# Patient Record
Sex: Female | Born: 1950 | Race: White | Hispanic: No | Marital: Married | Smoking: Never smoker
Health system: Southern US, Community
[De-identification: ages and names within clinical notes are randomized; demographics above are authoritative.]

## PROBLEM LIST (undated history)

## (undated) DIAGNOSIS — E079 Disorder of thyroid, unspecified: Secondary | ICD-10-CM

## (undated) DIAGNOSIS — I00 Rheumatic fever without heart involvement: Secondary | ICD-10-CM

## (undated) DIAGNOSIS — E785 Hyperlipidemia, unspecified: Secondary | ICD-10-CM

## (undated) HISTORY — DX: Disorder of thyroid, unspecified: E07.9

## (undated) HISTORY — PX: PROLAPSED UTERINE FIBROID LIGATION: SHX5400

## (undated) HISTORY — DX: Rheumatic fever without heart involvement: I00

## (undated) HISTORY — DX: Hyperlipidemia, unspecified: E78.5

---

## 2013-03-07 ENCOUNTER — Encounter: Payer: Self-pay | Admitting: Family Medicine

## 2013-03-07 ENCOUNTER — Ambulatory Visit (INDEPENDENT_AMBULATORY_CARE_PROVIDER_SITE_OTHER): Admitting: Family Medicine

## 2013-03-07 VITALS — BP 143/82 | HR 73 | Wt 136.0 lb

## 2013-03-07 DIAGNOSIS — R413 Other amnesia: Secondary | ICD-10-CM

## 2013-03-07 DIAGNOSIS — R5383 Other fatigue: Secondary | ICD-10-CM

## 2013-03-07 DIAGNOSIS — W57XXXA Bitten or stung by nonvenomous insect and other nonvenomous arthropods, initial encounter: Secondary | ICD-10-CM

## 2013-03-07 DIAGNOSIS — E039 Hypothyroidism, unspecified: Secondary | ICD-10-CM

## 2013-03-07 DIAGNOSIS — R5381 Other malaise: Secondary | ICD-10-CM

## 2013-03-07 LAB — VITAMIN B12: Vitamin B-12: 350 pg/mL (ref 211–911)

## 2013-03-07 LAB — T4, FREE: Free T4: 1.3 ng/dL (ref 0.80–1.80)

## 2013-03-07 LAB — RPR

## 2013-03-07 LAB — TSH: TSH: 1.34 u[IU]/mL (ref 0.350–4.500)

## 2013-03-07 MED ORDER — PHOSPHATIDYLSERINE-DHA-EPA 100-19.5-6.5 MG PO CAPS: 1.0000 | ORAL_CAPSULE | Freq: Every day | ORAL | Status: DC

## 2013-03-07 MED ORDER — FOLIC ACID 1 MG PO TABS: 1.0000 mg | ORAL_TABLET | Freq: Every day | ORAL | Status: AC

## 2013-03-07 MED ORDER — L-METHYLFOLATE-B12-B6-B2 6-1-50-5 MG PO TABS: 1.0000 | ORAL_TABLET | Freq: Every day | ORAL | Status: DC

## 2013-03-07 MED ORDER — CYANOCOBALAMIN 1000 MCG/ML IJ SOLN: 1000.0000 ug | Freq: Once | INTRAMUSCULAR | Status: DC

## 2013-03-07 NOTE — Progress Notes (Signed)
  Subjective:    Patient ID: Shannon Shelton, female    DOB: 04/30/1951, 62 y.o.   MRN: [id number]  HPI  Shannon Shelton is here today to discuss her memory.  She has noticed that she has had some trouble with her short term memory for the past 3 weeks.  She has taken some Vitamin B-12 to see if this would help her memory.  She did not notice any improvement.  She had had a tick bite in the past and is concerned about Lyme Disease.     Review of Systems  Constitutional: Negative for fatigue.  Neurological:       She has been having problems with her short-term memory.    Psychiatric/Behavioral: Positive for decreased concentration.    Past Medical History  Diagnosis Date  . Hyperlipidemia   . Rheumatic fever   . Thyroid disease     hyperlipidemia    Family History  Problem Relation Age of Onset  . Heart disease Father   . Cancer Maternal Grandmother     Colon Cancer    History   Social History Narrative   Marital Status: Married Radiation protection practitioner)    Children:  Sons (2)    Pets: Dogs (2)    Living Situation: Lives with  husband   Occupation:  Retired (Real estate)    Education: Engineer, maintenance (IT)    Tobacco Use/Exposure:  None    Alcohol Use:  Occasional   Drug Use:  None   Diet:  Regular   Exercise:  Walking   Hobbies: Yard Work/Photography                 Objective:   Physical Exam  Constitutional: She appears well-nourished. No distress.  HENT:  Head: Normocephalic.  Eyes: No scleral icterus.  Neck: No thyromegaly present.  Cardiovascular: Normal rate, regular rhythm and normal heart sounds.   Pulmonary/Chest: Effort normal and breath sounds normal.  Abdominal: There is no tenderness.  Musculoskeletal: She exhibits no edema and no tenderness.  Neurological: She is alert.  Skin: Skin is warm and dry.  Psychiatric: She has a normal mood and affect. Her behavior is normal. Judgment and thought content normal.          Assessment & Plan:

## 2013-03-08 LAB — LYME AB/WESTERN BLOT REFLEX: B burgdorferi Ab IgG+IgM: 0.26 {ISR}

## 2013-03-09 ENCOUNTER — Other Ambulatory Visit: Payer: Self-pay | Admitting: *Deleted

## 2013-03-09 NOTE — Telephone Encounter (Signed)
Attempted several times to contact patient for her lab results. These were mailed to her today. PG

## 2013-03-10 ENCOUNTER — Telehealth: Payer: Self-pay

## 2013-03-10 NOTE — Telephone Encounter (Signed)
Patient returning your phone call. Calling about test results.

## 2013-04-02 ENCOUNTER — Encounter: Payer: Self-pay | Admitting: Family Medicine

## 2013-04-02 DIAGNOSIS — R5383 Other fatigue: Secondary | ICD-10-CM | POA: Insufficient documentation

## 2013-04-02 DIAGNOSIS — E039 Hypothyroidism, unspecified: Secondary | ICD-10-CM | POA: Insufficient documentation

## 2013-04-02 DIAGNOSIS — W57XXXA Bitten or stung by nonvenomous insect and other nonvenomous arthropods, initial encounter: Secondary | ICD-10-CM | POA: Insufficient documentation

## 2013-04-02 DIAGNOSIS — R413 Other amnesia: Secondary | ICD-10-CM | POA: Insufficient documentation

## 2013-04-02 DIAGNOSIS — R5381 Other malaise: Secondary | ICD-10-CM | POA: Insufficient documentation

## 2013-04-02 NOTE — Assessment & Plan Note (Signed)
Checking her for Lyme Disease.

## 2013-04-02 NOTE — Assessment & Plan Note (Signed)
Checking her thyroid levels.

## 2013-04-02 NOTE — Assessment & Plan Note (Signed)
Checking her Vitamin B12 level.

## 2013-04-02 NOTE — Assessment & Plan Note (Addendum)
Checking some labs to see if there is any reason for her memory problems.  She was given prescriptions for a couple of "medical foods" designed for memory.

## 2013-04-23 ENCOUNTER — Other Ambulatory Visit: Payer: Self-pay | Admitting: Family Medicine

## 2013-06-23 ENCOUNTER — Encounter: Payer: Self-pay | Admitting: Family Medicine

## 2013-06-23 ENCOUNTER — Ambulatory Visit (INDEPENDENT_AMBULATORY_CARE_PROVIDER_SITE_OTHER): Admitting: Family Medicine

## 2013-06-23 VITALS — BP 115/79 | HR 73 | Resp 16 | Ht 66.5 in | Wt 137.0 lb

## 2013-06-23 DIAGNOSIS — Z780 Asymptomatic menopausal state: Secondary | ICD-10-CM

## 2013-06-23 DIAGNOSIS — R413 Other amnesia: Secondary | ICD-10-CM

## 2013-06-23 DIAGNOSIS — N951 Menopausal and female climacteric states: Secondary | ICD-10-CM

## 2013-06-23 DIAGNOSIS — Z23 Encounter for immunization: Secondary | ICD-10-CM

## 2013-06-23 DIAGNOSIS — E039 Hypothyroidism, unspecified: Secondary | ICD-10-CM

## 2013-06-23 MED ORDER — LEVOTHYROXINE SODIUM 112 MCG PO TABS: 112.0000 ug | ORAL_TABLET | Freq: Every day | ORAL | Status: AC

## 2013-06-23 MED ORDER — PROGESTERONE MICRONIZED 100 MG PO CAPS: 100.0000 mg | ORAL_CAPSULE | Freq: Every day | ORAL | Status: DC

## 2013-06-23 MED ORDER — NONFORMULARY OR COMPOUNDED ITEM: 1.0000 "application " | Freq: Every day | Status: AC

## 2013-06-23 NOTE — Patient Instructions (Addendum)
1)  Memory -   We will refer you to Dr. Stacy Gardner for a memory evaluation.  She will decide if you need a CT scan vs MRI or any other evaluation she feels is warranted. If the imaging studies are normal then you need to have a neuropsychological evaluation to help [place] know how best to treat your symptoms.  We'll send her a copy of the labs we did in June.    2)  Hypothyroidism - Refills were sent to [facility].  3)  Menopause - Refills were sent to [facility] and [facility].    4)  Preventative - Remember to get your Mammogram.      Dementia Dementia is a general term for problems with brain function. A person with dementia has memory loss and a hard time with at least one other brain function such as thinking, speaking, or problem solving. Dementia can affect social functioning, how you do your job, your mood, or your personality. The changes may be hidden for a long time. The earliest forms of this disease are usually not detected by family or friends. Dementia can be:  Irreversible.  Potentially reversible.  Partially reversible.  Progressive. This means it can get worse over time. CAUSES  Irreversible dementia causes may include:  Degeneration of brain cells (Alzheimer's disease or lewy body dementia).  Multiple small strokes (vascular dementia).  Infection (chronic meningitis or Creutzfelt-Jakob disease).  Frontotemporal dementia. This affects younger people, age 50 to 55, compared to those who have Alzheimer's disease.  Dementia associated with other disorders like Parkinson's disease, Huntington's disease, or HIV-associated dementia. Potentially or partially reversible dementia causes may include:  Medicines.  Metabolic causes such as excessive alcohol intake, vitamin B12 deficiency, or thyroid disease.  Masses or pressure in the brain such as a tumor, blood clot, or hydrocephalus. SYMPTOMS  Symptoms are often hard to detect. Family members or coworkers  may not notice them early in the disease process. Different people with dementia may have different symptoms. Symptoms can include:  A hard time with memory, especially recent memory. Long-term memory may not be impaired.  Asking the same question multiple times or forgetting something someone just said.  A hard time speaking your thoughts or finding certain words.  A hard time solving problems or performing familiar tasks (such as how to use a telephone).  Sudden changes in mood.  Changes in personality, especially increasing moodiness or mistrust.  Depression.  A hard time understanding complex ideas that were never a problem in the past. DIAGNOSIS  There are no specific tests for dementia.   Your caregiver may recommend a thorough evaluation. This is because some forms of dementia can be reversible. The evaluation will likely include a physical exam and getting a detailed history from you and a family member. The history often gives the best clues and suggestions for a diagnosis.  Memory testing may be done. A detailed brain function evaluation called neuropsychologic testing may be helpful.  Lab tests and brain imaging (such as a CT scan or MRI scan) are sometimes important.  Sometimes observation and re-evaluation over time is very helpful. TREATMENT  Treatment depends on the cause.   If the problem is a vitamin deficiency, it may be helped or cured with supplements.  For dementias such as Alzheimer's disease, medicines are available to stabilize or slow the course of the disease. There are no cures for this type of dementia.  Your caregiver can help direct you to groups,  organizations, and other caregivers to help with decisions in the care of you or your loved one. HOME CARE INSTRUCTIONS The care of individuals with dementia is varied and dependent upon the progression of the dementia. The following suggestions are intended for the person living with, or caring for, the  person with dementia.  Create a safe environment.  Remove the locks on bathroom doors to prevent the person from accidentally locking himself or herself in.  Use childproof latches on kitchen cabinets and any place where cleaning supplies, chemicals, or alcohol are kept.  Use childproof covers in unused electrical outlets.  Install childproof devices to keep doors and windows secured.  Remove stove knobs or install safety knobs and an automatic shut-off on the stove.  Lower the temperature on water heaters.  Label medicines and keep them locked up.  Secure knives, lighters, matches, power tools, and guns, and keep these items out of reach.  Keep the house free from clutter. Remove rugs or anything that might contribute to a fall.  Remove objects that might break and hurt the person.  Make sure lighting is good, both inside and outside.  Install grab rails as needed.  Use a monitoring device to alert you to falls or other needs for help.  Reduce confusion.  Keep familiar objects and people around.  Use night lights or dim lights at night.  Label items or areas.  Use reminders, notes, or directions for daily activities or tasks.  Keep a simple, consistent routine for waking, meals, bathing, dressing, and bedtime.  Create a calm, quiet environment.  Place large clocks and calendars prominently.  Display emergency numbers and home address near all telephones.  Use cues to establish different times of the day. An example is to open curtains to let the natural light in during the day.   Use effective communication.  Choose simple words and short sentences.  Use a gentle, calm tone of voice.  Be careful not to interrupt.  If the person is struggling to find a word or communicate a thought, try to provide the word or thought.  Ask one question at a time. Allow the person ample time to answer questions. Repeat the question again if the person does not  respond.  Reduce nighttime restlessness.  Provide a comfortable bed.  Have a consistent nighttime routine.  Ensure a regular walking or physical activity schedule. Involve the person in daily activities as much as possible.  Limit napping during the day.  Limit caffeine.  Attend social events that stimulate rather than overwhelm the senses.  Encourage good nutrition and hydration.  Reduce distractions during meal times and snacks.  Avoid foods that are too hot or too cold.  Monitor chewing and swallowing ability.  Continue with routine vision, hearing, dental, and medical screenings.  Only give over-the-counter or prescription medicines as directed by the caregiver.  Monitor driving abilities. Do not allow the person to drive when safe driving is no longer possible.  Register with an identification program which could provide location assistance in the event of a missing person situation. SEEK MEDICAL CARE IF:   New behavioral problems start such as moodiness, aggressiveness, or seeing things that are not there (hallucinations).  Any new problem with brain function happens. This includes problems with balance, speech, or falling a lot.  Problems with swallowing develop.  Any symptoms of other illness happen. Small changes or worsening in any aspect of brain function can be a sign that the illness is getting  worse. It can also be a sign of another medical illness such as infection. Seeing a caregiver right away is important. SEEK IMMEDIATE MEDICAL CARE IF:   A fever develops.  New or worsened confusion develops.  New or worsened sleepiness develops.  Staying awake becomes hard to do. Document Released: 03/03/2001 Document Revised: 11/30/2011 Document Reviewed: 02/02/2011 [facility] Patient Information 2014 [place], [place].

## 2013-06-23 NOTE — Progress Notes (Signed)
Subjective:    Patient ID: Shannon Shelton, female    DOB: 08/18/1951, 62 y.o.   MRN: [id number]  HPI  Shannon Shelton is here today to discuss the conditions listed below:    1)  Short Term Memory Loss:  She continues to struggle with this problem.  She did not fill the DHA-EPA and Cerofolin prescribed in her last office visit.  She is taking Iron 325 mg which seem to help her some.  Since her meossmory loss came on so suddenly, she would like to have an evaluation to possibly find out the cause.  She has been reading about silent strokes which she feels may explain her symptoms. She reports that only a couple of people in her family have had any memory issues (Maternal Great Grandmother and Maternal Uncle).  She would like to be referred to Dr. Stacy Gardner for further evaluation.    2)  HRT:  She needs a refill on her Prometrium and BI-Est Cream.   3)  Hypothyroidism:  She is doing well on her current dose of levothyroxine.  She needs a refill on it.    4)  Mood:  Currently, she feels that her mood is good and her energy level is back to normal.   In early June, when the memory issues started she also noted that her mood was not as good.  She felt more depressed than usual and was tearful at times which is very much not her normal state.  She started taking some iron and says that these symptoms have improved but the memory has not.      Review of Systems  Constitutional: Negative.   HENT: Negative.   Eyes: Negative.   Respiratory: Negative.   Cardiovascular: Negative.   Gastrointestinal: Negative.   Endocrine: Negative.   Genitourinary: Negative.   Musculoskeletal: Negative.   Skin: Negative.   Allergic/Immunologic: Negative.   Neurological:       Short Term Memory   Hematological: Negative.   Psychiatric/Behavioral:       She does not feel depressed or anxious and is sleeping fine.      Past Medical History  Diagnosis Date  . Hyperlipidemia   . Rheumatic fever   . Thyroid disease      hyperlipidemia     Family History  Problem Relation Age of Onset  . Heart disease Father   . Cancer Maternal Grandmother     Colon Cancer     History   Social History Narrative   Marital Status: Married Radiation protection practitioner)    Children:  Sons (2)    Pets: Dogs (2)    Living Situation: Lives with  husband   Occupation:  Retired (Real estate)    Education: Engineer, maintenance (IT)    Tobacco Use/Exposure:  None    Alcohol Use:  Occasional   Drug Use:  None   Diet:  Regular   Exercise:  Walking   Hobbies: Yard Work/Photography               Objective:   Physical Exam  Vitals reviewed. Constitutional: She is oriented to person, place, and time. She appears well-developed and well-nourished.  Eyes: Conjunctivae are normal. No scleral icterus.  Neck: Neck supple. No thyromegaly present.  Cardiovascular: Normal rate, regular rhythm and normal heart sounds.   Pulmonary/Chest: Effort normal and breath sounds normal.  Musculoskeletal: She exhibits no edema and no tenderness.  Lymphadenopathy:    She has no cervical adenopathy.  Neurological: She is alert and oriented  to person, place, and time.  Skin: Skin is warm and dry.  Psychiatric: She has a normal mood and affect. Her behavior is normal. Judgment and thought content normal.      Assessment & Plan:

## 2013-06-24 DIAGNOSIS — Z23 Encounter for immunization: Secondary | ICD-10-CM | POA: Insufficient documentation

## 2013-06-24 DIAGNOSIS — Z780 Asymptomatic menopausal state: Secondary | ICD-10-CM | POA: Insufficient documentation

## 2013-06-24 NOTE — Assessment & Plan Note (Signed)
The patient confirmed that they are not allergic to eggs and have never had a bad reaction with the flu shot in the past.  The vaccination was given without difficulty.   

## 2013-06-24 NOTE — Assessment & Plan Note (Signed)
Refilled her Prometrium and Bi-est cream.

## 2013-06-24 NOTE — Assessment & Plan Note (Signed)
We'll refer her to Dr. Stacy Gardner.

## 2013-06-24 NOTE — Assessment & Plan Note (Signed)
Refilled her levothyroxine.   

## 2014-03-27 ENCOUNTER — Encounter (HOSPITAL_COMMUNITY): Payer: Self-pay | Admitting: Emergency Medicine

## 2014-03-27 ENCOUNTER — Emergency Department (HOSPITAL_COMMUNITY)

## 2014-03-27 ENCOUNTER — Other Ambulatory Visit: Payer: Self-pay

## 2014-03-27 ENCOUNTER — Emergency Department (HOSPITAL_COMMUNITY)
Admission: EM | Admit: 2014-03-27 | Discharge: 2014-03-27 | Disposition: A | Discharge status: Home / Self Care | Attending: Emergency Medicine | Admitting: Emergency Medicine

## 2014-03-27 DIAGNOSIS — Z79899 Other long term (current) drug therapy: Secondary | ICD-10-CM | POA: Insufficient documentation

## 2014-03-27 DIAGNOSIS — Z8669 Personal history of other diseases of the nervous system and sense organs: Secondary | ICD-10-CM | POA: Insufficient documentation

## 2014-03-27 DIAGNOSIS — I10 Essential (primary) hypertension: Secondary | ICD-10-CM | POA: Insufficient documentation

## 2014-03-27 DIAGNOSIS — R42 Dizziness and giddiness: Secondary | ICD-10-CM | POA: Diagnosis not present

## 2014-03-27 DIAGNOSIS — E785 Hyperlipidemia, unspecified: Secondary | ICD-10-CM | POA: Diagnosis not present

## 2014-03-27 DIAGNOSIS — R002 Palpitations: Secondary | ICD-10-CM | POA: Diagnosis not present

## 2014-03-27 DIAGNOSIS — R079 Chest pain, unspecified: Secondary | ICD-10-CM | POA: Diagnosis present

## 2014-03-27 DIAGNOSIS — R110 Nausea: Secondary | ICD-10-CM | POA: Insufficient documentation

## 2014-03-27 DIAGNOSIS — Z8679 Personal history of other diseases of the circulatory system: Secondary | ICD-10-CM

## 2014-03-27 DIAGNOSIS — E039 Hypothyroidism, unspecified: Secondary | ICD-10-CM

## 2014-03-27 LAB — CBC WITH DIFFERENTIAL/PLATELET
BASOS PCT: 1 % (ref 0–1)
Basophils Absolute: 0.1 10*3/uL (ref 0.0–0.1)
EOS ABS: 0.4 10*3/uL (ref 0.0–0.7)
Eosinophils Relative: 7 % — ABNORMAL HIGH (ref 0–5)
HEMATOCRIT: 40.5 % (ref 36.0–46.0)
HEMOGLOBIN: 13.6 g/dL (ref 12.0–15.0)
Lymphocytes Relative: 27 % (ref 12–46)
Lymphs Abs: 1.5 10*3/uL (ref 0.7–4.0)
MCH: 30.9 pg (ref 26.0–34.0)
MCHC: 33.6 g/dL (ref 30.0–36.0)
MCV: 92 fL (ref 78.0–100.0)
MONO ABS: 0.4 10*3/uL (ref 0.1–1.0)
MONOS PCT: 8 % (ref 3–12)
Neutro Abs: 3.2 10*3/uL (ref 1.7–7.7)
Neutrophils Relative %: 57 % (ref 43–77)
Platelets: 244 10*3/uL (ref 150–400)
RBC: 4.4 MIL/uL (ref 3.87–5.11)
RDW: 13 % (ref 11.5–15.5)
WBC: 5.6 10*3/uL (ref 4.0–10.5)

## 2014-03-27 LAB — BASIC METABOLIC PANEL
Anion gap: 10 (ref 5–15)
BUN: 10 mg/dL (ref 6–23)
CO2: 28 mEq/L (ref 19–32)
CREATININE: 0.78 mg/dL (ref 0.50–1.10)
Calcium: 9.2 mg/dL (ref 8.4–10.5)
Chloride: 103 mEq/L (ref 96–112)
GFR, EST NON AFRICAN AMERICAN: 88 mL/min — AB (ref >90)
Glucose, Bld: 100 mg/dL — ABNORMAL HIGH (ref 70–99)
Potassium: 5 mEq/L (ref 3.7–5.3)
Sodium: 141 mEq/L (ref 137–147)

## 2014-03-27 LAB — TSH: TSH: 7.29 u[IU]/mL — ABNORMAL HIGH (ref 0.350–4.500)

## 2014-03-27 LAB — TROPONIN I: Troponin I: <0.3 ng/mL (ref <0.30)

## 2014-03-27 NOTE — ED Notes (Signed)
Pt reports cardiologist just left bedside and talking with EDP.

## 2014-03-27 NOTE — Discharge Instructions (Signed)
Tests were good. Followup with Dr Purvis SheffieldKoneswaran as discussed.  Return if worse in any way

## 2014-03-27 NOTE — Progress Notes (Signed)
Received in basket from Dr.koneswaran,asked to sched exercise nuc,echo and event monitor followed by apt with Dr.Koneswaran.Pt still in ED per Rosey Batheresa in ED,asked that she stop by cardiology to have monitor placed on and instructions for tests given

## 2014-03-27 NOTE — ED Provider Notes (Addendum)
CSN: [id number]     Arrival date & time 03/27/14  [id number] History  This chart was scribed for Donnetta HutchingBrian Dreux Mcgroarty, MD,  by Ashley JacobsBrittany Andrews, ED Scribe. The patient was seen in room APA04/APA04 and the patient's care was started at 1:49 PM.   First MD Initiated Contact with Patient 03/27/14 [phone]     Chief Complaint  Patient presents with  . Chest Pain     (Consider location/radiation/quality/duration/timing/severity/associated sxs/prior Treatment) Patient is a 63 y.o. female presenting with chest pain. The history is provided by the patient and medical records. No language interpreter was used.  Chest Pain Pain location:  Substernal area and L chest Pain radiates to:  Does not radiate Pain radiates to the back: no   Pain severity:  Mild Onset quality:  Sudden Timing:  Constant Progression:  Partially resolved Chronicity:  New Relieved by:  Nothing Associated symptoms: nausea and palpitations   Associated symptoms: not vomiting    HPI Comments: Shannon Shelton is a 63 y.o. female who is borderline HTN presents to the Emergency Department complaining of intermittent, mild, left central chest pain that was much worse this morning, onset one week ago.The pain feels like a mild cramping sensation. She had several episodes today PTA and one while ED today. The episodes generally last 15 minutes. Pt mentions having the sensation that her heart was  fluttering last night as she was lying down.  She also felt as though her heart rate was decreasing. She denies having any present chest pain. Denies hx of any prior heart conditions. Pt had nausea and lightheadedness  as an associated symptoms.Pt reguraly takes thyriod medications, vitmain D, and iron tables. Pt's father had rheumatic fever and died from emphysema. . Pt's cardiologist is Dr. Beverely Paceheek. She had a stress test and EKG three years ago and the results were normal.   Pt does not smoke tobacco. She drinks alcohol once a month.   Past Medical History   Diagnosis Date  . Hyperlipidemia   . Rheumatic fever   . Thyroid disease     hyperlipidemia   History reviewed. No pertinent past surgical history. Family History  Problem Relation Age of Onset  . Heart disease Father   . COPD Father     He was a smoker   . Cancer Maternal Grandmother     Colon Cancer  . Cancer Cousin     Brain Tumor    History  Substance Use Topics  . Smoking status: Never Smoker   . Smokeless tobacco: Never Used  . Alcohol Use: Yes   OB History   Grav Para Term Preterm Abortions TAB SAB Ect Mult Living                 Review of Systems  Cardiovascular: Positive for palpitations. Negative for chest pain.  Gastrointestinal: Positive for nausea. Negative for vomiting.  Neurological: Positive for light-headedness.  All other systems reviewed and are negative.     Allergies  Review of patient's allergies indicates no known allergies.  Home Medications   Prior to Admission medications   Medication Sig Start Date End Date Taking? Authorizing Provider  ergocalciferol (VITAMIN D2) 50000 UNITS capsule Take 50,000 Units by mouth once a week.    Yes Historical Provider, MD  levothyroxine (SYNTHROID, LEVOTHROID) 112 MCG tablet Take 1 tablet (112 mcg total) by mouth daily. 06/23/13 06/23/14 Yes Gillian Scarceobyn K Zanard, MD  NONFORMULARY OR COMPOUNDED ITEM Apply 1 application topically daily. Bi-Est Cream 80/20 (Estriol/Estradiol) 5mg /gm, Apply 1cc  to inner labia per day. 06/23/13 06/23/14 Yes Gillian Scarceobyn K Zanard, MD  progesterone (PROMETRIUM) 100 MG capsule Take 100 mg by mouth every other day.   Yes Historical Provider, MD   BP 108/61  Pulse 61  Temp(Src) 98.2 F (36.8 C) (Oral)  Resp 18  Ht 5\' 7"  (1.702 m)  Wt 138 lb (62.596 kg)  BMI 21.61 kg/m2  SpO2 100% Physical Exam  Nursing note and vitals reviewed. Constitutional: She is oriented to person, place, and time. She appears well-developed and well-nourished.  HENT:  Head: Normocephalic and atraumatic.  Eyes:  Conjunctivae and EOM are normal. Pupils are equal, round, and reactive to light.  Neck: Normal range of motion. Neck supple.  Cardiovascular: Normal rate, regular rhythm and normal heart sounds.   Pulmonary/Chest: Effort normal and breath sounds normal.  Abdominal: Soft. Bowel sounds are normal.  Musculoskeletal: Normal range of motion.  Neurological: She is alert and oriented to person, place, and time.  Skin: Skin is warm and dry.  Psychiatric: She has a normal mood and affect. Her behavior is normal.    ED Course  Procedures (including critical care time) DIAGNOSTIC STUDIES: Oxygen Saturation is 100% on room air, normal by my interpretation.    COORDINATION OF CARE:  1:49 PM Discussed course of care with pt . Pt understands and agrees.   Labs Review Labs Reviewed  CBC WITH DIFFERENTIAL - Abnormal; Notable for the following:    Eosinophils Relative 7 (*)    All other components within normal limits  BASIC METABOLIC PANEL - Abnormal; Notable for the following:    Glucose, Bld 100 (*)    GFR calc non Af Amer 88 (*)    All other components within normal limits  TROPONIN I  TROPONIN I  TSH    Imaging Review Dg Chest 2 View  03/27/2014   CLINICAL DATA:  One week history of chest pain  EXAM: CHEST  2 VIEW  COMPARISON:  None.  FINDINGS: The lungs are mildly hyperinflated but clear. The heart and mediastinal structures are unremarkable. There is no pleural effusion or pneumothorax. There is gentle dextroscoliosis of the mid thoracic spine.  IMPRESSION: There is no acute cardiopulmonary abnormality. Mild hyperinflation may be voluntary or could reflect underlying reactive airway disease.   Electronically Signed   By: David  [place]az[place]   On: 03/27/2014 08:30     EKG Interpretation   Date/Time:  Tuesday March 27 2014 06:47:51 EDT Ventricular Rate:  62 PR Interval:  158 QRS Duration: 96 QT Interval:  432 QTC Calculation: 439 R Axis:   66 Text Interpretation:  Sinus rhythm Confirmed  by Corrine Tillis  MD, Mamye Bolds ([id number]) on  03/27/2014 10:40:33 AM      MDM   Final diagnoses:  Chest pain, unspecified chest pain type   Chest pain for one week..  Minimal risk factor profile other than hyperlipidemia.  Negative family history. Consult cardiology 1350   cardiology consult obtained. Troponins negative x2. She will get outpatient followup with  cards  I personally performed the services described in this documentation, which was scribed in my presence. The recorded information has been reviewed and is accurate.    Donnetta HutchingBrian Trenell Concannon, MD 03/27/14 1132  Donnetta HutchingBrian Hisham Provence, MD 03/27/14 1350

## 2014-03-27 NOTE — ED Notes (Signed)
[facility] called and reported for pt to go to their office at d/c.

## 2014-03-27 NOTE — ED Notes (Signed)
Patient states that she has been having irregular heart beats at night when she lays down and that her heart rate drops. Also states that she had chest pain this morning but is not currently having chest pain at this time. Denies history of heart problems.

## 2014-03-27 NOTE — ED Notes (Signed)
EDP at bedside re-evaluating pt.

## 2014-03-27 NOTE — ED Notes (Signed)
Pt d/c to [facility]ology per cardiologist request.

## 2014-03-27 NOTE — Consult Note (Signed)
CARDIOLOGY CONSULT NOTE  Patient ID: Shannon Shelton MRN: [id number] DOB/AGE: [birth year] 63 y.o.  Admit date: 03/27/2014 Primary Physician Birdena JubileeZANARD, ROBYN, MD  Reason for Consultation: chest pain, palpitations  HPI: The patient is a 63 yr old woman with a history of hypothyroidism and rheumatic fever as a child who has been experiencing palpitations for the last week. She has noticed them more in the evening while lying down, and has monitored her HR and has found it to be 50 bpm with occasional skipped beats. She experiences nausea with these. This morning, she was awoken by chest pain which was retrosternal and described as an ache. It lasted about 10 minutes and spontaneously resolved. She says it was not severe, but wanted to be evaluated given her palpitations over the past week. She denies associated shortness of breath, lightheadedness, and nausea with the chest pain. She denies orthopnea, leg swelling, and PND. She denies a history of syncope. Initial troponin and ECG are entirely normal, as is chest xray.  Soc: Married. Originally from [place]. Nonsmoker, no ETOH.  Fam: Father had arrhythmia and rheumatic fever.  No Known Allergies  No current facility-administered medications for this encounter.   Current Outpatient Prescriptions  Medication Sig Dispense Refill  . ergocalciferol (VITAMIN D2) 50000 UNITS capsule Take 50,000 Units by mouth once a week.       . levothyroxine (SYNTHROID, LEVOTHROID) 112 MCG tablet Take 1 tablet (112 mcg total) by mouth daily.  90 tablet  3  . NONFORMULARY OR COMPOUNDED ITEM Apply 1 application topically daily. Bi-Est Cream 80/20 (Estriol/Estradiol) 5mg /gm, Apply 1cc to inner labia per day.  1 each  11  . progesterone (PROMETRIUM) 100 MG capsule Take 100 mg by mouth every other day.        Past Medical History  Diagnosis Date  . Hyperlipidemia   . Rheumatic fever   . Thyroid disease     hyperlipidemia    History reviewed. No pertinent  past surgical history.  History   Social History  . Marital Status: Married    Spouse Name: Peyton NajjarLarry    Number of Children: 2  . Years of Education: 14   Occupational History  . Retired    Social History Main Topics  . Smoking status: Never Smoker   . Smokeless tobacco: Never Used  . Alcohol Use: Yes  . Drug Use: No  . Sexual Activity: Yes   Other Topics Concern  . Not on file   Social History Narrative   Marital Status: Married Peyton Najjar(Larry)    Children:  Sons (2)    Pets: Dogs (2)    Living Situation: Lives with  husband   Occupation:  Retired (Real estate)    Education: Engineer, maintenance (IT)College Graduate    Tobacco Use/Exposure:  None    Alcohol Use:  Occasional   Drug Use:  None   Diet:  Regular   Exercise:  Walking   Hobbies: Yard Work/Photography              No family history of premature CAD in 1st degree relatives.  Prior to Admission medications   Medication Sig Start Date End Date Taking? Authorizing Provider  ergocalciferol (VITAMIN D2) 50000 UNITS capsule Take 50,000 Units by mouth once a week.    Yes Historical Provider, MD  levothyroxine (SYNTHROID, LEVOTHROID) 112 MCG tablet Take 1 tablet (112 mcg total) by mouth daily. 06/23/13 06/23/14 Yes Gillian Scarceobyn K Zanard, MD  NONFORMULARY OR COMPOUNDED ITEM Apply 1 application topically  daily. Bi-Est Cream 80/20 (Estriol/Estradiol) 5mg /gm, Apply 1cc to inner labia per day. 06/23/13 06/23/14 Yes Gillian Scarceobyn K Zanard, MD  progesterone (PROMETRIUM) 100 MG capsule Take 100 mg by mouth every other day.   Yes Historical Provider, MD     Review of systems complete and found to be negative unless listed above in HPI     Physical exam Blood pressure 122/64, pulse 61, temperature 98.2 F (36.8 C), temperature source Oral, resp. rate 18, height 5\' 7"  (1.702 m), weight 138 lb (62.596 kg), SpO2 100.00%. General: NAD Neck: No JVD, no thyromegaly or thyroid nodule.  Lungs: Clear to auscultation bilaterally with normal respiratory effort. CV:  Nondisplaced PMI. Regular rate and rhythm, normal S1/S2, no S3/S4, no murmur.  No peripheral edema.  No carotid bruit.  Normal pedal pulses.  Abdomen: Soft, nontender, no hepatosplenomegaly, no distention.  Skin: Intact without lesions or rashes.  Neurologic: Alert and oriented x 3.  Psych: Normal affect. Extremities: No clubbing or cyanosis.  HEENT: Normal.   Labs:   Lab Results  Component Value Date   WBC 5.6 03/27/2014   HGB 13.6 03/27/2014   HCT 40.5 03/27/2014   MCV 92.0 03/27/2014   PLT 244 03/27/2014    Recent Labs Lab 03/27/14 0652  NA 141  K 5.0  CL 103  CO2 28  BUN 10  CREATININE 0.78  CALCIUM 9.2  GLUCOSE 100*   Lab Results  Component Value Date   TROPONINI <0.30 03/27/2014    No results found for this basename: CHOL   No results found for this basename: HDL   No results found for this basename: LDLCALC   No results found for this basename: TRIG   No results found for this basename: CHOLHDL   No results found for this basename: LDLDIRECT         Studies: Dg Chest 2 View  03/27/2014   CLINICAL DATA:  One week history of chest pain  EXAM: CHEST  2 VIEW  COMPARISON:  None.  FINDINGS: The lungs are mildly hyperinflated but clear. The heart and mediastinal structures are unremarkable. There is no pleural effusion or pneumothorax. There is gentle dextroscoliosis of the mid thoracic spine.  IMPRESSION: There is no acute cardiopulmonary abnormality. Mild hyperinflation may be voluntary or could reflect underlying reactive airway disease.   Electronically Signed   By: David  [place]   On: 03/27/2014 08:30    ASSESSMENT AND PLAN:  1. Chest pain: First troponin and ECG are normal. No further episodes of chest discomfort. Will check a repeat troponin now. If normal, she can be discharged from the ED and I will arrange for an outpatient echocardiogram and exercise Cardiolite stress test for further clarification. I did not appreciate any abnormalities on cardiovascular  examination. The patient is in favor of this plan, and I have discussed this with Dr. Adriana Simasook. 2. Palpitations: I will obtain an outpatient 2-week event monitor to evaluate for bradyarrhythmias and premature contractions.  Dispo: I will arrange for outpatient f/u with me after testing is completed.  Signed: Prentice DockerSuresh Koneswaran, M.D., F.A.C.C.  03/27/2014, 12:12 PM

## 2014-04-04 ENCOUNTER — Ambulatory Visit (HOSPITAL_COMMUNITY)
Admission: RE | Admit: 2014-04-04 | Discharge: 2014-04-04 | Disposition: A | Discharge status: Home / Self Care | Source: Ambulatory Visit | Attending: Cardiovascular Disease | Admitting: Cardiovascular Disease

## 2014-04-04 ENCOUNTER — Encounter (HOSPITAL_COMMUNITY)
Admission: RE | Admit: 2014-04-04 | Discharge: 2014-04-04 | Disposition: A | Discharge status: Home / Self Care | Source: Ambulatory Visit | Attending: Cardiovascular Disease | Admitting: Cardiovascular Disease

## 2014-04-04 ENCOUNTER — Encounter (HOSPITAL_COMMUNITY): Payer: Self-pay

## 2014-04-04 ENCOUNTER — Telehealth: Payer: Self-pay | Admitting: *Deleted

## 2014-04-04 DIAGNOSIS — R079 Chest pain, unspecified: Secondary | ICD-10-CM

## 2014-04-04 DIAGNOSIS — I059 Rheumatic mitral valve disease, unspecified: Secondary | ICD-10-CM

## 2014-04-04 DIAGNOSIS — R072 Precordial pain: Secondary | ICD-10-CM | POA: Insufficient documentation

## 2014-04-04 DIAGNOSIS — R002 Palpitations: Secondary | ICD-10-CM | POA: Diagnosis not present

## 2014-04-04 MED ORDER — SODIUM CHLORIDE 0.9 % IJ SOLN
10.0000 mL | INTRAMUSCULAR | Status: DC | PRN
  Administered 2014-04-04: 10 mL via INTRAVENOUS

## 2014-04-04 MED ORDER — TECHNETIUM TC 99M SESTAMIBI - CARDIOLITE
30.0000 | Freq: Once | INTRAVENOUS | Status: AC | PRN
  Administered 2014-04-04: 11:00:00 30 via INTRAVENOUS

## 2014-04-04 MED ORDER — TECHNETIUM TC 99M SESTAMIBI GENERIC - CARDIOLITE
10.0000 | Freq: Once | INTRAVENOUS | Status: AC | PRN
  Administered 2014-04-04: 10 via INTRAVENOUS

## 2014-04-04 MED ORDER — SODIUM CHLORIDE 0.9 % IJ SOLN
INTRAMUSCULAR | Status: AC
  Administered 2014-04-04: 10 mL via INTRAVENOUS
  Filled 2014-04-04: qty 10

## 2014-04-04 NOTE — Telephone Encounter (Signed)
Pt notified of result, understood and very happy

## 2014-04-04 NOTE — Telephone Encounter (Signed)
Message copied by Vernon PreyBARKER, Aylah Yeary T on Wed Apr 04, 2014  3:59 PM ------      Message from: Prentice DockerKONESWARAN, SURESH A      Created: Wed Apr 04, 2014  3:40 PM       Normal pumping function. Good results. ------

## 2014-04-04 NOTE — Progress Notes (Signed)
  Echocardiogram 2D Echocardiogram has been performed.  Shannon Shelton 04/04/2014, 9:15 AM

## 2014-04-04 NOTE — Progress Notes (Signed)
Stress Lab Nurses Notes - [facility]  Kendrick FriesLinda Tousley 04/04/2014 Reason for doing test: Chest Pain and palpitation Type of test: Stress Cardiolite Nurse performing test: Parke PoissonPhyllis Billingsly, RN Nuclear Medicine Tech: Lyndel Pleasureyan Liles Echo Tech: Not Applicable MD performing test: Nilda RiggsBranch Family MD: Zanard Test explained and consent signed: Yes.   IV started: 22g jelco, Saline lock flushed, No redness or edema and Saline lock started in radiology Symptoms: None Treatment/Intervention: None Reason test stopped: reached target HR After recovery IV was: Discontinued via X-ray tech and No redness or edema Patient to return to Nuc. Med at : 11:45 Patient discharged: Home Patient's Condition upon discharge was: stable Comments: During test peak BP 174/85 & HR 166 .  Recovery BP 127/72 & HR 83.  Symptoms resolved in recovery. Erskine SpeedBillingsley, Sava Proby T

## 2014-04-26 ENCOUNTER — Other Ambulatory Visit: Payer: Self-pay | Admitting: *Deleted

## 2014-04-26 DIAGNOSIS — R079 Chest pain, unspecified: Secondary | ICD-10-CM

## 2014-05-02 ENCOUNTER — Ambulatory Visit (INDEPENDENT_AMBULATORY_CARE_PROVIDER_SITE_OTHER): Admitting: Cardiovascular Disease

## 2014-05-02 ENCOUNTER — Encounter: Payer: Self-pay | Admitting: Cardiovascular Disease

## 2014-05-02 VITALS — BP 122/86 | HR 87 | Ht 67.0 in | Wt 139.0 lb

## 2014-05-02 DIAGNOSIS — E039 Hypothyroidism, unspecified: Secondary | ICD-10-CM

## 2014-05-02 DIAGNOSIS — R946 Abnormal results of thyroid function studies: Secondary | ICD-10-CM

## 2014-05-02 DIAGNOSIS — R7989 Other specified abnormal findings of blood chemistry: Secondary | ICD-10-CM

## 2014-05-02 DIAGNOSIS — R002 Palpitations: Secondary | ICD-10-CM

## 2014-05-02 DIAGNOSIS — Z136 Encounter for screening for cardiovascular disorders: Secondary | ICD-10-CM

## 2014-05-02 DIAGNOSIS — IMO0001 Reserved for inherently not codable concepts without codable children: Secondary | ICD-10-CM

## 2014-05-02 NOTE — Patient Instructions (Signed)
Your physician recommends that you schedule a follow-up appointment in:  3 months    Your physician recommends that you continue on your current medications as directed. Please refer to the Current Medication list given to you today.     Thank you for choosing [facility] !   

## 2014-05-02 NOTE — Progress Notes (Signed)
Patient ID: Shannon Shelton, female   DOB: 21-Jan-1951, 63 y.o.   MRN: [id number]      SUBJECTIVE: The patient is a 63 year old woman with a history of hypothyroidism, whom I evaluated for chest pain and palpitations in early July. Echocardiography demonstrated normal left ventricular systolic function, EF 55-60% with mild mitral regurgitation. Nuclear myocardial perfusion study was normal demonstrating excellent functional capacity. Event monitoring demonstrated sinus rhythm and sinus tachycardia with PACs and PVCs and symptoms correlating with all of these. TSH elevated at 7.3 on 7/7.  She has yet to see her primary care physician but is due for a followup in the near future. She said her levothyroxine dosage has remained stable for several years. In the past 2 days, she has not had any palpitations. She had one mild episode of chest discomfort after her emergency room visit but none since that time.   Review of Systems: As per "subjective", otherwise negative.  No Known Allergies  Current Outpatient Prescriptions  Medication Sig Dispense Refill  . ergocalciferol (VITAMIN D2) 50000 UNITS capsule Take 50,000 Units by mouth once a week.       . levothyroxine (SYNTHROID, LEVOTHROID) 112 MCG tablet Take 1 tablet (112 mcg total) by mouth daily.  90 tablet  3  . NONFORMULARY OR COMPOUNDED ITEM Apply 1 application topically daily. Bi-Est Cream 80/20 (Estriol/Estradiol) 5mg /gm, Apply 1cc to inner labia per day.  1 each  11  . progesterone (PROMETRIUM) 100 MG capsule Take 100 mg by mouth every other day.      . Vitamin D, Ergocalciferol, (DRISDOL) 50000 UNITS CAPS capsule Take by mouth.       No current facility-administered medications for this visit.    Past Medical History  Diagnosis Date  . Hyperlipidemia   . Rheumatic fever   . Thyroid disease     hyperlipidemia    No past surgical history on file.  History   Social History  . Marital Status: Married    Spouse Name: Peyton Najjar    Number  of Children: 2  . Years of Education: 14   Occupational History  . Retired    Social History Main Topics  . Smoking status: Never Smoker   . Smokeless tobacco: Never Used  . Alcohol Use: Yes  . Drug Use: No  . Sexual Activity: Yes   Other Topics Concern  . Not on file   Social History Narrative   Marital Status: Married Peyton Najjar)    Children:  Sons (2)    Pets: Dogs (2)    Living Situation: Lives with  husband   Occupation:  Retired (Real estate)    Education: Engineer, maintenance (IT)    Tobacco Use/Exposure:  None    Alcohol Use:  Occasional   Drug Use:  None   Diet:  Regular   Exercise:  Walking   Hobbies: Yard Work/Photography              Filed Vitals:   05/02/14 1502  BP: 122/86  Pulse: 87  Height: 5\' 7"  (1.702 m)  Weight: 139 lb (63.05 kg)  SpO2: 97%    PHYSICAL EXAM General: NAD Neck: No JVD, no thyromegaly. Lungs: Clear to auscultation bilaterally with normal respiratory effort. CV: Nondisplaced PMI.  Regular rate and rhythm, normal S1/S2, no S3/S4, no murmur. No pretibial or periankle edema.  No carotid bruit.  Normal pedal pulses.  Abdomen: Soft, nontender, no hepatosplenomegaly, no distention.  Neurologic: Alert and oriented x 3.  Psych: Normal affect. Extremities: No clubbing  or cyanosis.   ECG: reviewed and available in electronic records.      ASSESSMENT AND PLAN: 1. Chest pain: No recurrences. Given normal exercise myocardial perfusion study, no further testing is indicated.  2. Palpitations: Symptoms correlate with sinus rhythm/sinus tachycardia with PAC's/PVC's. TSH mildly elevated in early July. Would recommend increasing dose of Synthroid. Given that her symptoms have improved recently, I will not add any medications. If they were to recur in spite of a normal TSH, I would consider short-acting calcium channel blockers.  Dispo: f/u 3 months.  Prentice DockerSuresh Koneswaran, M.D., F.A.C.C.

## 2014-05-12 ENCOUNTER — Other Ambulatory Visit: Payer: Self-pay | Admitting: Family Medicine

## 2014-08-02 ENCOUNTER — Ambulatory Visit (INDEPENDENT_AMBULATORY_CARE_PROVIDER_SITE_OTHER): Admitting: Cardiovascular Disease

## 2014-08-02 ENCOUNTER — Encounter: Payer: Self-pay | Admitting: Cardiovascular Disease

## 2014-08-02 VITALS — BP 122/64 | HR 67 | Ht 67.0 in | Wt 144.0 lb

## 2014-08-02 DIAGNOSIS — R946 Abnormal results of thyroid function studies: Secondary | ICD-10-CM | POA: Diagnosis not present

## 2014-08-02 DIAGNOSIS — R002 Palpitations: Secondary | ICD-10-CM | POA: Diagnosis not present

## 2014-08-02 DIAGNOSIS — R7989 Other specified abnormal findings of blood chemistry: Secondary | ICD-10-CM

## 2014-08-02 NOTE — Patient Instructions (Signed)
Your physician recommends that you schedule a follow-up appointment in: only if needed    Thank you for choosing [facility] !         

## 2014-08-02 NOTE — Progress Notes (Signed)
Patient ID: Shannon Shelton, female   DOB: 01-01-1951, 63 y.o.   MRN: [id number]      SUBJECTIVE: The patient is a 63 year old woman with a history of hypothyroidism, whom I evaluated for chest pain and palpitations in early July 2015. Echocardiography demonstrated normal left ventricular systolic function, EF 55-60% with mild mitral regurgitation. Nuclear myocardial perfusion study was normal demonstrating excellent functional capacity. Event monitoring demonstrated sinus rhythm and sinus tachycardia with PACs and PVCs and symptoms correlating with all of these. TSH elevated at 7.3 on 7/7. Since then, she has been feeling well and denies chest pain, shortness of breath, leg swelling, and palpitations. She began taking magnesium which appears to have helped.   Review of Systems: As per "subjective", otherwise negative.  No Known Allergies  Current Outpatient Prescriptions  Medication Sig Dispense Refill  . ergocalciferol (VITAMIN D2) 50000 UNITS capsule Take 50,000 Units by mouth once a week.     . estradiol (ESTRACE) 0.1 MG/GM vaginal cream Place vaginally.    . progesterone (PROMETRIUM) 100 MG capsule Take 100 mg by mouth every other day.    . Vitamin D, Ergocalciferol, (DRISDOL) 50000 UNITS CAPS capsule Take by mouth.    . levothyroxine (SYNTHROID, LEVOTHROID) 112 MCG tablet Take 1 tablet (112 mcg total) by mouth daily. 90 tablet 3   No current facility-administered medications for this visit.    Past Medical History  Diagnosis Date  . Hyperlipidemia   . Rheumatic fever   . Thyroid disease     hyperlipidemia    No past surgical history on file.  History   Social History  . Marital Status: Married    Spouse Name: Peyton NajjarLarry    Number of Children: 2  . Years of Education: 14   Occupational History  . Retired    Social History Main Topics  . Smoking status: Never Smoker   . Smokeless tobacco: Never Used  . Alcohol Use: Yes  . Drug Use: No  . Sexual Activity: Yes   Other  Topics Concern  . Not on file   Social History Narrative   Marital Status: Married Peyton Najjar(Larry)    Children:  Sons (2)    Pets: Dogs (2)    Living Situation: Lives with  husband   Occupation:  Retired (Real estate)    Education: Engineer, maintenance (IT)College Graduate    Tobacco Use/Exposure:  None    Alcohol Use:  Occasional   Drug Use:  None   Diet:  Regular   Exercise:  Walking   Hobbies: Yard Work/Photography              Filed Vitals:   08/02/14 0904  BP: 122/64  Pulse: 67  Height: 5\' 7"  (1.702 m)  Weight: 144 lb (65.318 kg)    PHYSICAL EXAM General: NAD HEENT: Normal. Neck: No JVD, no thyromegaly. Lungs: Clear to auscultation bilaterally with normal respiratory effort. CV: Nondisplaced PMI.  Regular rate and rhythm, normal S1/S2, no S3/S4, no murmur. No pretibial or periankle edema.  No carotid bruit.  Normal pedal pulses.  Abdomen: Soft, nontender, no hepatosplenomegaly, no distention.  Neurologic: Alert and oriented x 3.  Psych: Normal affect. Skin: Normal. Musculoskeletal: Normal range of motion, no gross deformities. Extremities: No clubbing or cyanosis.   ECG: Most recent ECG reviewed.      ASSESSMENT AND PLAN: 1. Chest pain: No recurrences. Given normal exercise myocardial perfusion study, no further testing is indicated.  2. Palpitations: Symptomatically stable. Previous symptoms correlated with sinus rhythm/sinus tachycardia with PAC's/PVC's.  TSH mildly elevated in early July but was reportedly repeated and found to be reasonable. Given that her symptoms have resolved, I will not add any medications. If they were to recur in spite of a normal TSH, I would consider short-acting calcium channel blockers.  Dispo: f/u prn.   Prentice DockerSuresh Semiah Konczal, M.D., F.A.C.C.

## 2016-12-03 ENCOUNTER — Other Ambulatory Visit (HOSPITAL_COMMUNITY): Payer: Self-pay | Admitting: Family Medicine

## 2016-12-03 DIAGNOSIS — Z1231 Encounter for screening mammogram for malignant neoplasm of breast: Secondary | ICD-10-CM

## 2016-12-10 ENCOUNTER — Ambulatory Visit (HOSPITAL_COMMUNITY)

## 2016-12-17 ENCOUNTER — Ambulatory Visit (HOSPITAL_COMMUNITY)
Admission: RE | Admit: 2016-12-17 | Discharge: 2016-12-17 | Disposition: A | Discharge status: Home / Self Care | Payer: Medicare Other | Source: Ambulatory Visit | Attending: Family Medicine | Admitting: Family Medicine

## 2016-12-17 DIAGNOSIS — Z1231 Encounter for screening mammogram for malignant neoplasm of breast: Secondary | ICD-10-CM | POA: Diagnosis present

## 2018-04-19 ENCOUNTER — Other Ambulatory Visit (HOSPITAL_COMMUNITY): Payer: Self-pay | Admitting: Family Medicine

## 2018-04-19 DIAGNOSIS — Z1231 Encounter for screening mammogram for malignant neoplasm of breast: Secondary | ICD-10-CM

## 2018-04-28 ENCOUNTER — Ambulatory Visit (HOSPITAL_COMMUNITY)
Admission: RE | Admit: 2018-04-28 | Discharge: 2018-04-28 | Disposition: A | Discharge status: Home / Self Care | Payer: Medicare Other | Source: Ambulatory Visit | Attending: Family Medicine | Admitting: Family Medicine

## 2018-04-28 ENCOUNTER — Encounter (HOSPITAL_COMMUNITY): Payer: Self-pay

## 2018-04-28 DIAGNOSIS — Z1231 Encounter for screening mammogram for malignant neoplasm of breast: Secondary | ICD-10-CM | POA: Diagnosis present

## 2019-12-13 ENCOUNTER — Other Ambulatory Visit (HOSPITAL_COMMUNITY): Payer: Self-pay | Admitting: Family Medicine

## 2019-12-13 DIAGNOSIS — Z1231 Encounter for screening mammogram for malignant neoplasm of breast: Secondary | ICD-10-CM

## 2019-12-22 ENCOUNTER — Other Ambulatory Visit: Payer: Self-pay

## 2019-12-22 ENCOUNTER — Ambulatory Visit (HOSPITAL_COMMUNITY)
Admission: RE | Admit: 2019-12-22 | Discharge: 2019-12-22 | Disposition: A | Discharge status: Home / Self Care | Payer: Medicare Other | Source: Ambulatory Visit | Attending: Family Medicine | Admitting: Family Medicine

## 2019-12-22 DIAGNOSIS — Z1231 Encounter for screening mammogram for malignant neoplasm of breast: Secondary | ICD-10-CM | POA: Diagnosis not present

## 2020-12-04 IMAGING — MG DIGITAL SCREENING BILAT W/ TOMO W/ CAD
6 of 10 series · 6 of 30 positions shown · non-contrast
Comparison: Previous exam(s).

CLINICAL DATA: Screening.

EXAM:
DIGITAL SCREENING BILATERAL MAMMOGRAM WITH TOMO AND CAD

[L MLO synth-2D (1 of 2)]
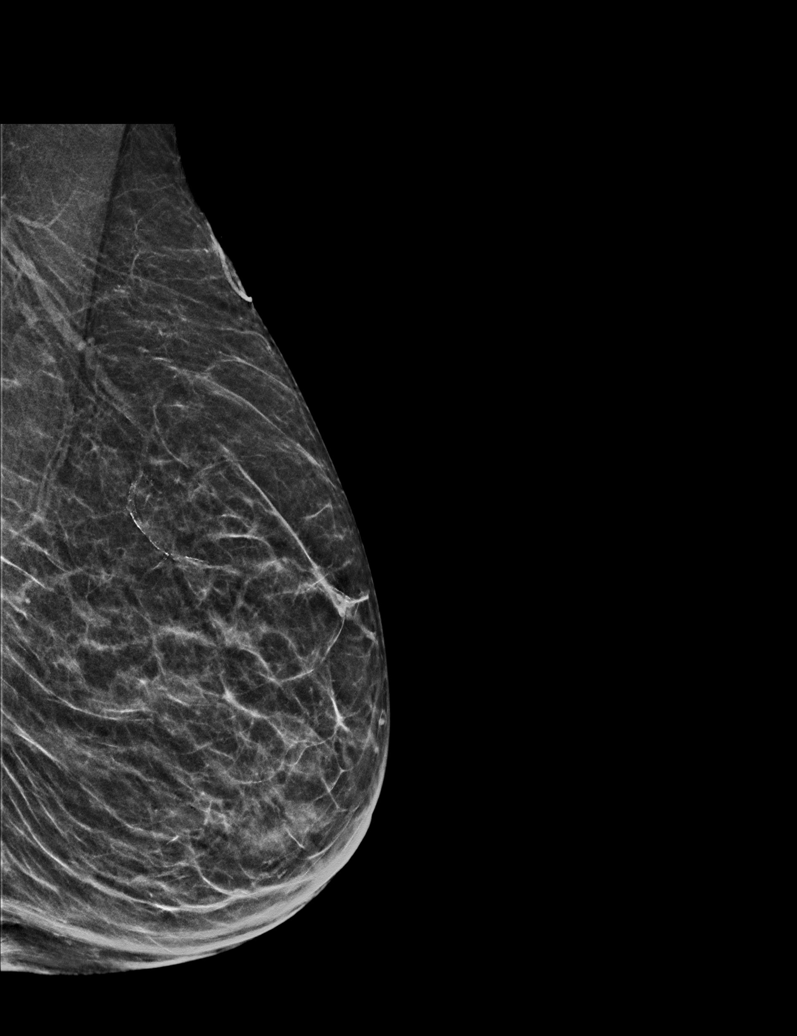

[L CC synth-2D]
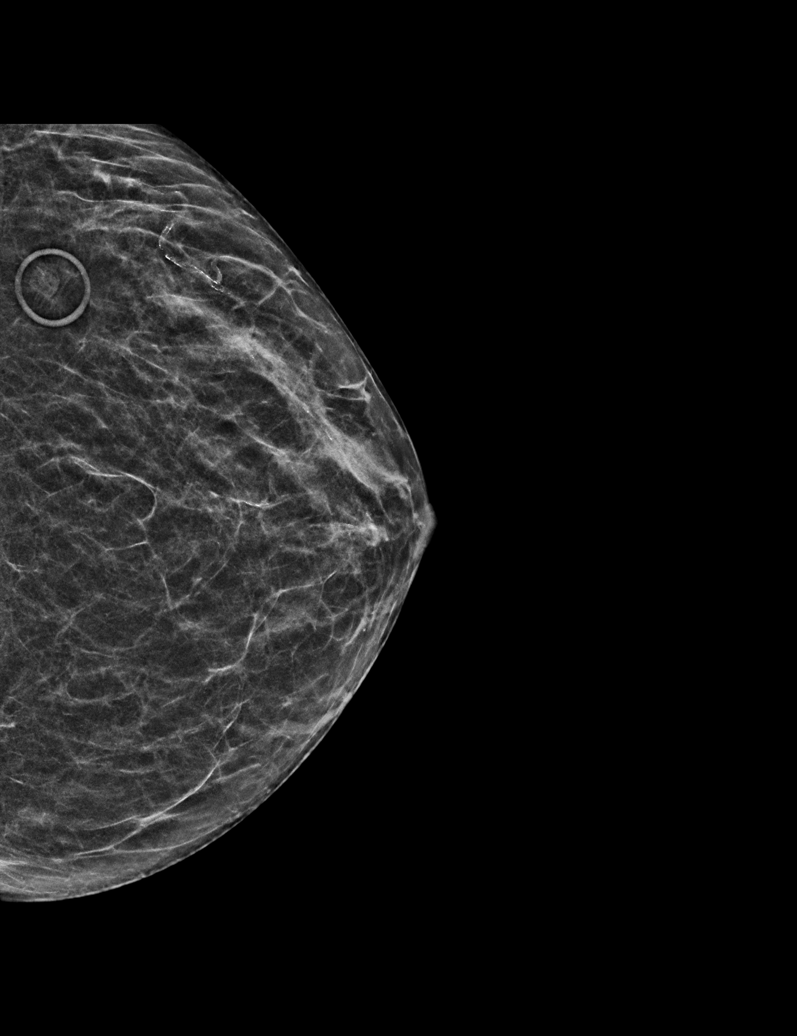

[R CC synth-2D]
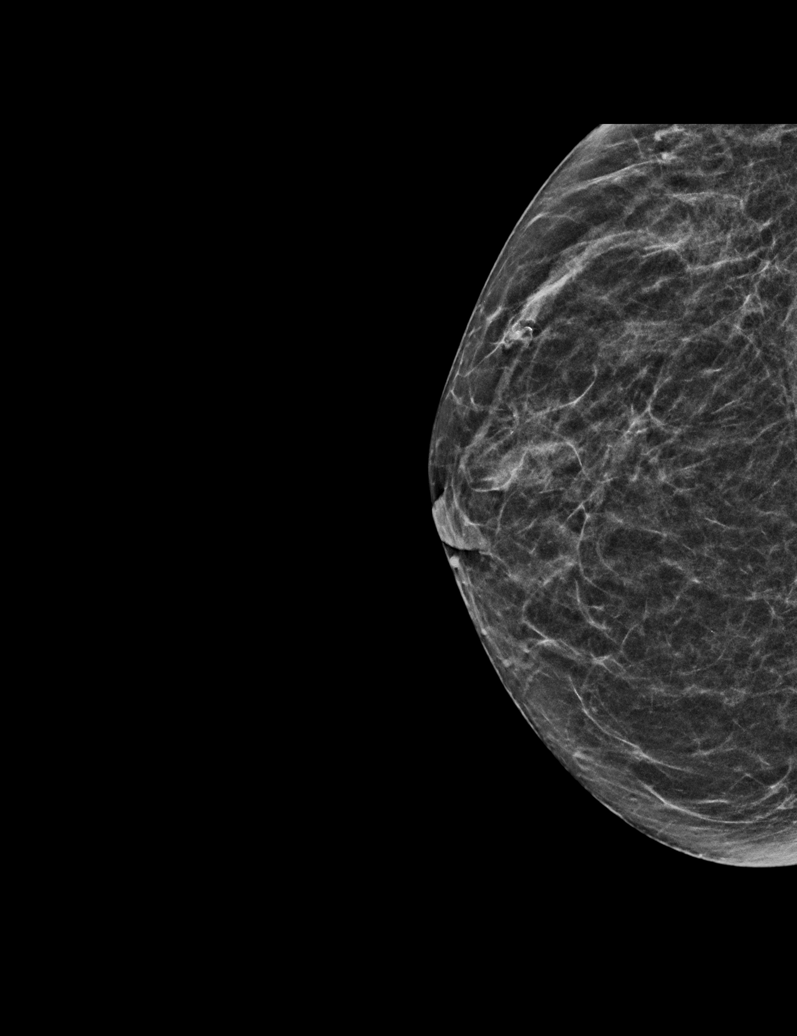

[R MLO synth-2D]
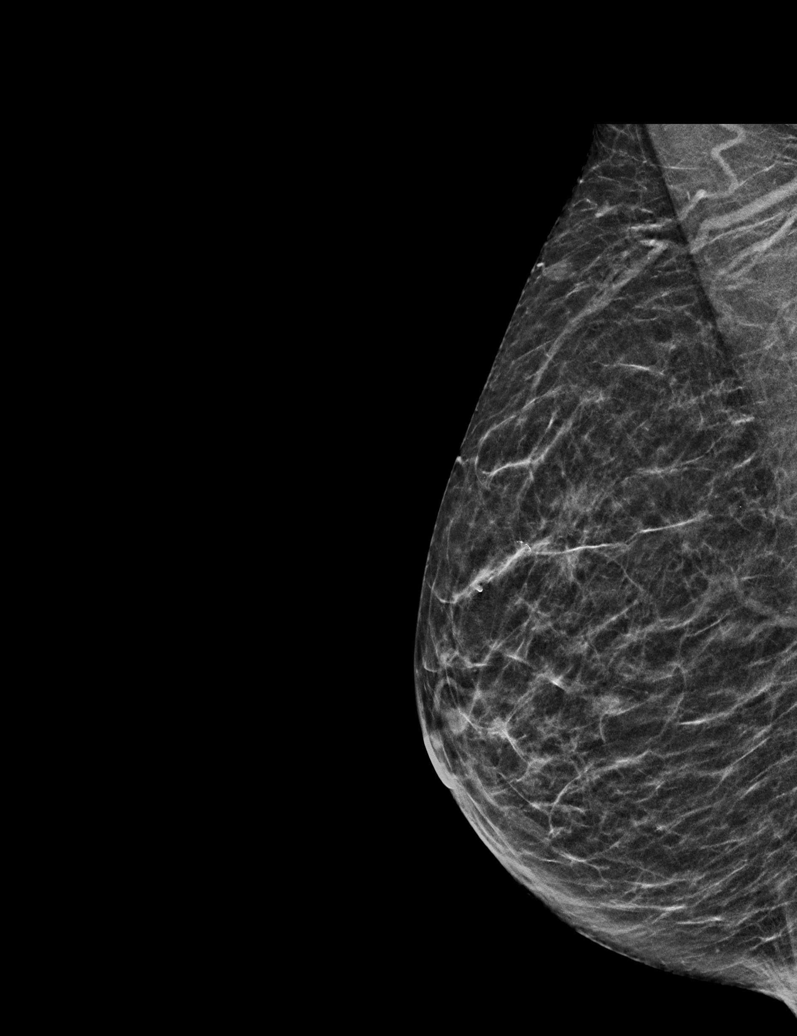

[L MLO synth-2D (2 of 2)]
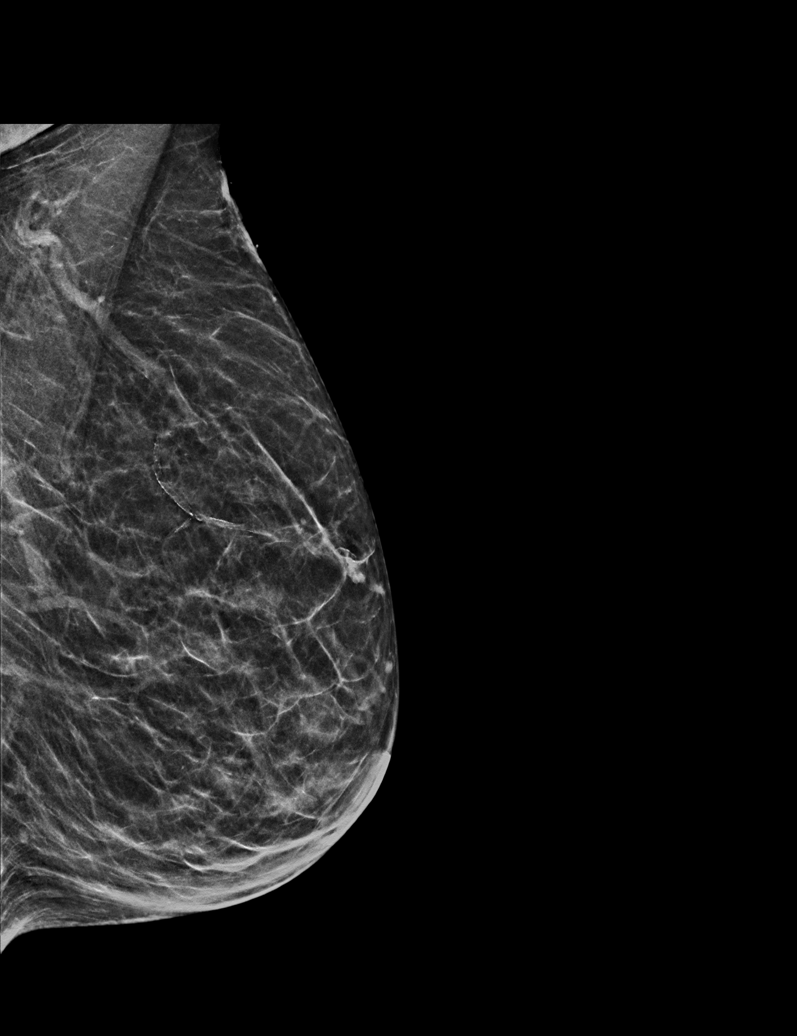

[L MLO tomo · tomo slice 24/47.0]
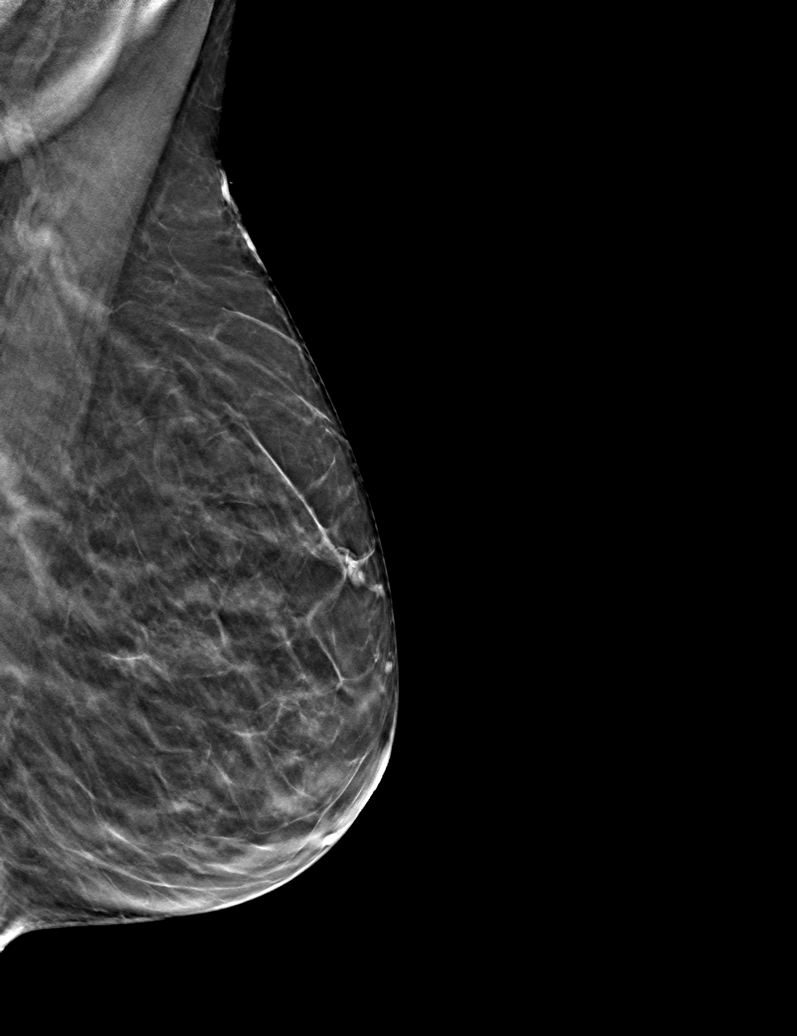

[6 of 30 positions shown; findings below may reference images not displayed]

ACR Breast Density Category b: There are scattered areas of
fibroglandular density.
FINDINGS: There are no findings suspicious for malignancy. Images were
processed with CAD.
IMPRESSION: No mammographic evidence of malignancy. A result letter of this
screening mammogram will be mailed directly to the patient.

RECOMMENDATION:
Screening mammogram in one year. (Code:[id number])

BI-RADS CATEGORY  1: Negative.

## 2021-01-20 ENCOUNTER — Other Ambulatory Visit (HOSPITAL_COMMUNITY): Payer: Self-pay | Admitting: Family Medicine

## 2021-01-20 DIAGNOSIS — Z1231 Encounter for screening mammogram for malignant neoplasm of breast: Secondary | ICD-10-CM

## 2021-01-29 ENCOUNTER — Ambulatory Visit (HOSPITAL_COMMUNITY)
Admission: RE | Admit: 2021-01-29 | Discharge: 2021-01-29 | Disposition: A | Discharge status: Home / Self Care | Payer: Medicare Other | Source: Ambulatory Visit | Attending: Family Medicine | Admitting: Family Medicine

## 2021-01-29 ENCOUNTER — Other Ambulatory Visit: Payer: Self-pay

## 2021-01-29 ENCOUNTER — Other Ambulatory Visit (HOSPITAL_COMMUNITY): Payer: Self-pay | Admitting: Family Medicine

## 2021-01-29 DIAGNOSIS — E2839 Other primary ovarian failure: Secondary | ICD-10-CM

## 2021-01-29 DIAGNOSIS — Z1231 Encounter for screening mammogram for malignant neoplasm of breast: Secondary | ICD-10-CM | POA: Insufficient documentation

## 2021-01-29 DIAGNOSIS — Z780 Asymptomatic menopausal state: Secondary | ICD-10-CM | POA: Diagnosis not present

## 2021-01-29 DIAGNOSIS — Z13820 Encounter for screening for osteoporosis: Secondary | ICD-10-CM | POA: Insufficient documentation

## 2021-01-29 DIAGNOSIS — M85851 Other specified disorders of bone density and structure, right thigh: Secondary | ICD-10-CM | POA: Insufficient documentation

## 2021-04-23 DIAGNOSIS — N951 Menopausal and female climacteric states: Secondary | ICD-10-CM | POA: Insufficient documentation

## 2021-04-23 DIAGNOSIS — N814 Uterovaginal prolapse, unspecified: Secondary | ICD-10-CM | POA: Insufficient documentation

## 2022-01-13 DIAGNOSIS — N812 Incomplete uterovaginal prolapse: Secondary | ICD-10-CM | POA: Insufficient documentation

## 2022-02-27 ENCOUNTER — Other Ambulatory Visit (HOSPITAL_COMMUNITY): Payer: Self-pay | Admitting: Family Medicine

## 2022-02-27 DIAGNOSIS — Z1231 Encounter for screening mammogram for malignant neoplasm of breast: Secondary | ICD-10-CM

## 2022-03-05 ENCOUNTER — Ambulatory Visit (HOSPITAL_COMMUNITY)
Admission: RE | Admit: 2022-03-05 | Discharge: 2022-03-05 | Disposition: A | Discharge status: Home / Self Care | Payer: Medicare Other | Source: Ambulatory Visit | Attending: Family Medicine | Admitting: Family Medicine

## 2022-03-05 DIAGNOSIS — Z1231 Encounter for screening mammogram for malignant neoplasm of breast: Secondary | ICD-10-CM | POA: Diagnosis present

## 2022-03-10 ENCOUNTER — Other Ambulatory Visit: Payer: Self-pay

## 2022-03-10 ENCOUNTER — Emergency Department (HOSPITAL_COMMUNITY)
Admission: EM | Admit: 2022-03-10 | Discharge: 2022-03-10 | Disposition: A | Discharge status: Home / Self Care | Payer: Medicare Other | Attending: Emergency Medicine | Admitting: Emergency Medicine

## 2022-03-10 ENCOUNTER — Encounter (HOSPITAL_COMMUNITY): Payer: Self-pay | Admitting: *Deleted

## 2022-03-10 DIAGNOSIS — E039 Hypothyroidism, unspecified: Secondary | ICD-10-CM | POA: Insufficient documentation

## 2022-03-10 DIAGNOSIS — R350 Frequency of micturition: Secondary | ICD-10-CM | POA: Insufficient documentation

## 2022-03-10 DIAGNOSIS — R5383 Other fatigue: Secondary | ICD-10-CM | POA: Diagnosis not present

## 2022-03-10 DIAGNOSIS — M6281 Muscle weakness (generalized): Secondary | ICD-10-CM | POA: Insufficient documentation

## 2022-03-10 DIAGNOSIS — Z79899 Other long term (current) drug therapy: Secondary | ICD-10-CM | POA: Insufficient documentation

## 2022-03-10 LAB — URINALYSIS, ROUTINE W REFLEX MICROSCOPIC
Bacteria, UA: NONE SEEN
Bilirubin Urine: NEGATIVE
Glucose, UA: NEGATIVE mg/dL
Ketones, ur: 5 mg/dL — AB
Leukocytes,Ua: NEGATIVE
Nitrite: NEGATIVE
Protein, ur: NEGATIVE mg/dL
Specific Gravity, Urine: 1.012 (ref 1.005–1.030)
pH: 6 (ref 5.0–8.0)

## 2022-03-10 LAB — CBC WITH DIFFERENTIAL/PLATELET
Abs Immature Granulocytes: 0.02 10*3/uL (ref 0.00–0.07)
Basophils Absolute: 0.1 10*3/uL (ref 0.0–0.1)
Basophils Relative: 1 %
Eosinophils Absolute: 0.2 10*3/uL (ref 0.0–0.5)
Eosinophils Relative: 4 %
HCT: 38.3 % (ref 36.0–46.0)
Hemoglobin: 13 g/dL (ref 12.0–15.0)
Immature Granulocytes: 0 %
Lymphocytes Relative: 9 %
Lymphs Abs: 0.5 10*3/uL — ABNORMAL LOW (ref 0.7–4.0)
MCH: 30.5 pg (ref 26.0–34.0)
MCHC: 33.9 g/dL (ref 30.0–36.0)
MCV: 89.9 fL (ref 80.0–100.0)
Monocytes Absolute: 0.5 10*3/uL (ref 0.1–1.0)
Monocytes Relative: 9 %
Neutro Abs: 4.4 10*3/uL (ref 1.7–7.7)
Neutrophils Relative %: 77 %
Platelets: 202 10*3/uL (ref 150–400)
RBC: 4.26 MIL/uL (ref 3.87–5.11)
RDW: 13 % (ref 11.5–15.5)
WBC: 5.7 10*3/uL (ref 4.0–10.5)
nRBC: 0 % (ref 0.0–0.2)

## 2022-03-10 LAB — COMPREHENSIVE METABOLIC PANEL
ALT: 12 U/L (ref 0–44)
AST: 18 U/L (ref 15–41)
Albumin: 4.4 g/dL (ref 3.5–5.0)
Alkaline Phosphatase: 50 U/L (ref 38–126)
Anion gap: 9 (ref 5–15)
BUN: 14 mg/dL (ref 8–23)
CO2: 22 mmol/L (ref 22–32)
Calcium: 9.1 mg/dL (ref 8.9–10.3)
Chloride: 100 mmol/L (ref 98–111)
Creatinine, Ser: 0.89 mg/dL (ref 0.44–1.00)
GFR, Estimated: >60 mL/min (ref >60)
Glucose, Bld: 135 mg/dL — ABNORMAL HIGH (ref 70–99)
Potassium: 3.6 mmol/L (ref 3.5–5.1)
Sodium: 131 mmol/L — ABNORMAL LOW (ref 135–145)
Total Bilirubin: 0.4 mg/dL (ref 0.3–1.2)
Total Protein: 6.9 g/dL (ref 6.5–8.1)

## 2022-03-10 LAB — MAGNESIUM: Magnesium: 1.9 mg/dL (ref 1.7–2.4)

## 2022-03-10 LAB — TSH: TSH: 1.436 u[IU]/mL (ref 0.350–4.500)

## 2022-03-10 MED ORDER — ONDANSETRON HCL 4 MG/2ML IJ SOLN
4.0000 mg | Freq: Once | INTRAMUSCULAR | Status: AC
  Administered 2022-03-10: 4 mg via INTRAVENOUS
  Filled 2022-03-10: qty 2

## 2022-03-10 MED ORDER — MIRABEGRON ER 25 MG PO TB24: 25.0000 mg | ORAL_TABLET | Freq: Every day | ORAL | 0 refills | Status: DC

## 2022-03-10 MED ORDER — SODIUM CHLORIDE 0.9 % IV SOLN
INTRAVENOUS | Status: DC
  Administered 2022-03-10: 250 mL/h via INTRAVENOUS

## 2022-03-10 NOTE — ED Triage Notes (Signed)
Pt c/o increased weakness all over; pt c/o headache  Pt states she had uterine prolapse surgery last month and has been urinating more than usual

## 2022-03-10 NOTE — Discharge Instructions (Signed)
Please start taking the medication called Myrbetriq.  Take 1 tablet daily, you will need to follow-up with your urogynecologist who you saw for your surgery this week.  If you develop severe or worsening symptoms return to the emergency department.  I suspect that some of the fatigue that you are experiencing and the weakness is because of lack of sleep and some relative dehydration.  We have given you some IV fluids and checked all of your blood work and urine sample, everything else looks okay.  This medication may help stop some of the urinary frequency  Thank you for allowing [place] to treat you in the emergency department today.  After reviewing your examination and potential testing that was done it appears that you are safe to go home.  I would like for you to follow-up with your doctor within the next several days, have them obtain your results and follow-up with them to review all of these tests.  If you should develop severe or worsening symptoms return to the emergency department immediately

## 2022-03-10 NOTE — ED Provider Notes (Signed)
[facility] EMERGENCY DEPARTMENT Provider Note   CSN: [id number] Arrival date & time: 03/10/22  0932     History  Chief Complaint  Patient presents with   Weakness    Shannon Shelton is a 71 y.o. female.   Weakness  This patient is a 71 year old female, she reports that she has hypothyroidism on Synthroid, she takes some hormone medications including estradiol and progesterone.  She recently underwent a vaginal approach uterine prolapse surgery and did very well with it however postoperatively she developed some dysuria and urinary frequency, she has been tested twice for urinary tract infections and reports that both of the test were normal but because of her symptoms she was placed on Bactrim and then Macrobid.  She has not improved at all and has had some progressive generalized weakness, this is nonfocal and states that she just does not have any energy.  She has had decreased appetite but notes that she was able to eat a small dinner last night and an English muffin with peanut butter this morning.  She denies diarrhea, fever, chills, vomiting though she is nauseated.  She has minimal abdominal discomfort and postoperatively has not had any fevers or distention of her abdomen.  No swelling of the legs, no rashes of the skin, mild headache    Home Medications Prior to Admission medications   Medication Sig Start Date End Date Taking? Authorizing Provider  levothyroxine (SYNTHROID, LEVOTHROID) 112 MCG tablet Take 1 tablet (112 mcg total) by mouth daily. 06/23/13 03/10/22 Yes Zanard, Hinton Dyer, MD  mirabegron ER (MYRBETRIQ) 25 MG TB24 tablet Take 1 tablet (25 mg total) by mouth daily. 03/10/22 04/09/22 Yes Eber Hong, MD  Multiple Vitamins-Minerals (PRESERVISION AREDS 2 PO) Take 1 tablet by mouth in the morning and at bedtime. 10/23/18  Yes [provider]      Allergies    Patient has no known allergies.    Review of Systems   Review of Systems  Neurological:  Positive for  weakness.  All other systems reviewed and are negative.   Physical Exam Updated Vital Signs BP 124/72   Pulse 70   Temp 99.6 F (37.6 C) (Oral)   Resp 12   Ht 1.676 m (5\' 6" )   Wt 59.9 kg   SpO2 99%   BMI 21.31 kg/m  Physical Exam Vitals and nursing note reviewed.  Constitutional:      General: She is not in acute distress.    Appearance: She is well-developed.  HENT:     Head: Normocephalic and atraumatic.     Mouth/Throat:     Pharynx: No oropharyngeal exudate.  Eyes:     General: No scleral icterus.       Right eye: No discharge.        Left eye: No discharge.     Conjunctiva/sclera: Conjunctivae normal.     Pupils: Pupils are equal, round, and reactive to light.  Neck:     Thyroid: No thyromegaly.     Vascular: No JVD.  Cardiovascular:     Rate and Rhythm: Normal rate and regular rhythm.     Heart sounds: Normal heart sounds. No murmur heard.    No friction rub. No gallop.  Pulmonary:     Effort: Pulmonary effort is normal. No respiratory distress.     Breath sounds: Normal breath sounds. No wheezing or rales.  Abdominal:     General: Bowel sounds are normal. There is no distension.     Palpations: Abdomen is  soft. There is no mass.     Tenderness: There is no abdominal tenderness.  Musculoskeletal:        General: No tenderness. Normal range of motion.     Cervical back: Normal range of motion and neck supple.     Right lower leg: No edema.     Left lower leg: No edema.  Lymphadenopathy:     Cervical: No cervical adenopathy.  Skin:    General: Skin is warm and dry.     Findings: No erythema or rash.  Neurological:     Mental Status: She is alert.     Coordination: Coordination normal.  Psychiatric:        Behavior: Behavior normal.     ED Results / Procedures / Treatments   Labs (all labs ordered are listed, but only abnormal results are displayed) Labs Reviewed  CBC WITH DIFFERENTIAL/PLATELET - Abnormal; Notable for the following components:       Result Value   Lymphs Abs 0.5 (*)    All other components within normal limits  COMPREHENSIVE METABOLIC PANEL - Abnormal; Notable for the following components:   Sodium 131 (*)    Glucose, Bld 135 (*)    All other components within normal limits  URINALYSIS, ROUTINE W REFLEX MICROSCOPIC - Abnormal; Notable for the following components:   Hgb urine dipstick LARGE (*)    Ketones, ur 5 (*)    All other components within normal limits  URINE CULTURE  MAGNESIUM  TSH    EKG EKG Interpretation  Date/Time:  Tuesday March 10 2022 10:06:12 EDT Ventricular Rate:  84 PR Interval:  163 QRS Duration: 102 QT Interval:  375 QTC Calculation: 444 R Axis:   72 Text Interpretation: Sinus rhythm since last tracing no significant change Confirmed by Eber Hong ([id number]) on 03/10/2022 10:09:24 AM  Radiology No results found.  Procedures Procedures    Medications Ordered in ED Medications  0.9 %  sodium chloride infusion (250 mL/hr Intravenous New Bag/Given 03/10/22 1003)  ondansetron (ZOFRAN) injection 4 mg (4 mg Intravenous Given 03/10/22 9323)    ED Course/ Medical Decision Making/ A&P                           Medical Decision Making Amount and/or Complexity of Data Reviewed Labs: ordered. ECG/medicine tests: ordered.  Risk Prescription drug management.   This patient presents to the ED for concern of abdominal discomfort and urinary frequency with generalized weakness and fatigue, this involves an extensive number of treatment options, and is a complaint that carries with it a high risk of complications and morbidity.  The differential diagnosis includes urinary tract infection, potential electrolyte abnormalities, with the patient being on Bactrim recently would also consider hyperkalemia and renal insufficiency.   Co morbidities that complicate the patient evaluation  Recent surgery   Additional history obtained:  Additional history obtained from medical record External  records from outside source obtained and reviewed including prior surgery in [place], postop exam documented on June 8 in the surgical office, unremarkable   Lab Tests:  I Ordered, and personally interpreted labs.  The pertinent results include:  UA clean - labs unremarkable.   Cardiac Monitoring: / EKG:  The patient was maintained on a cardiac monitor.  I personally viewed and interpreted the cardiac monitored which showed an underlying rhythm of: Heart rate of 95, EKG unremarkable   Consultations Obtained:  I requested consultation with the urologist Dr. Pete Glatter,  and discussed lab and imaging findings as well as pertinent plan - they recommend: Starting a antispasmodic such as oxybutynin   Problem List / ED Course / Critical interventions / Medication management  Patient has a urogynecologist who did her surgery at [facility] in [place] who I encouraged her to follow-up with.  I recommended that she follow-up closely, recommended that she start Myrbetriq, she is agreeable, vital signs are normal, patient is well-appearing I ordered medication including IV fluids for dehydration Reevaluation of the patient after these medicines showed that the patient improved I have reviewed the patients home medicines and have made adjustments as needed   Social Determinants of Health:  None   Test / Admission - Considered:  Consultation with urologist, considered admission but patient stabilized and looks totally fine at discharge without any abnormal vital signs         Final Clinical Impression(s) / ED Diagnoses Final diagnoses:  Urinary frequency    Rx / DC Orders ED Discharge Orders          Ordered    mirabegron ER (MYRBETRIQ) 25 MG TB24 tablet  Daily        03/10/22 1227              Noemi Chapel, MD 03/10/22 1229

## 2022-03-11 LAB — URINE CULTURE: Culture: NO GROWTH

## 2022-03-24 ENCOUNTER — Emergency Department (HOSPITAL_COMMUNITY): Payer: Medicare Other

## 2022-03-24 ENCOUNTER — Emergency Department (HOSPITAL_COMMUNITY)
Admission: EM | Admit: 2022-03-24 | Discharge: 2022-03-24 | Disposition: A | Discharge status: Home / Self Care | Payer: Medicare Other | Attending: Emergency Medicine | Admitting: Emergency Medicine

## 2022-03-24 ENCOUNTER — Encounter (HOSPITAL_COMMUNITY): Payer: Self-pay

## 2022-03-24 ENCOUNTER — Other Ambulatory Visit: Payer: Self-pay

## 2022-03-24 DIAGNOSIS — R531 Weakness: Secondary | ICD-10-CM | POA: Diagnosis present

## 2022-03-24 DIAGNOSIS — Z79899 Other long term (current) drug therapy: Secondary | ICD-10-CM | POA: Insufficient documentation

## 2022-03-24 DIAGNOSIS — E039 Hypothyroidism, unspecified: Secondary | ICD-10-CM | POA: Diagnosis not present

## 2022-03-24 DIAGNOSIS — R350 Frequency of micturition: Secondary | ICD-10-CM | POA: Diagnosis not present

## 2022-03-24 DIAGNOSIS — R300 Dysuria: Secondary | ICD-10-CM | POA: Insufficient documentation

## 2022-03-24 DIAGNOSIS — R5383 Other fatigue: Secondary | ICD-10-CM | POA: Diagnosis not present

## 2022-03-24 LAB — URINALYSIS, ROUTINE W REFLEX MICROSCOPIC
Bilirubin Urine: NEGATIVE
Glucose, UA: NEGATIVE mg/dL
Ketones, ur: 5 mg/dL — AB
Leukocytes,Ua: NEGATIVE
Nitrite: NEGATIVE
Protein, ur: NEGATIVE mg/dL
Specific Gravity, Urine: 1.005 (ref 1.005–1.030)
pH: 8 (ref 5.0–8.0)

## 2022-03-24 LAB — CBC WITH DIFFERENTIAL/PLATELET
Abs Immature Granulocytes: 0.01 10*3/uL (ref 0.00–0.07)
Basophils Absolute: 0.1 10*3/uL (ref 0.0–0.1)
Basophils Relative: 2 %
Eosinophils Absolute: 0.2 10*3/uL (ref 0.0–0.5)
Eosinophils Relative: 3 %
HCT: 36.7 % (ref 36.0–46.0)
Hemoglobin: 12.1 g/dL (ref 12.0–15.0)
Immature Granulocytes: 0 %
Lymphocytes Relative: 26 %
Lymphs Abs: 1.8 10*3/uL (ref 0.7–4.0)
MCH: 30.1 pg (ref 26.0–34.0)
MCHC: 33 g/dL (ref 30.0–36.0)
MCV: 91.3 fL (ref 80.0–100.0)
Monocytes Absolute: 0.4 10*3/uL (ref 0.1–1.0)
Monocytes Relative: 6 %
Neutro Abs: 4.3 10*3/uL (ref 1.7–7.7)
Neutrophils Relative %: 63 %
Platelets: 263 10*3/uL (ref 150–400)
RBC: 4.02 MIL/uL (ref 3.87–5.11)
RDW: 12.8 % (ref 11.5–15.5)
WBC: 6.8 10*3/uL (ref 4.0–10.5)
nRBC: 0 % (ref 0.0–0.2)

## 2022-03-24 LAB — COMPREHENSIVE METABOLIC PANEL
ALT: 12 U/L (ref 0–44)
AST: 16 U/L (ref 15–41)
Albumin: 4.1 g/dL (ref 3.5–5.0)
Alkaline Phosphatase: 44 U/L (ref 38–126)
Anion gap: 11 (ref 5–15)
BUN: 11 mg/dL (ref 8–23)
CO2: 24 mmol/L (ref 22–32)
Calcium: 9 mg/dL (ref 8.9–10.3)
Chloride: 103 mmol/L (ref 98–111)
Creatinine, Ser: 0.68 mg/dL (ref 0.44–1.00)
GFR, Estimated: >60 mL/min (ref >60)
Glucose, Bld: 89 mg/dL (ref 70–99)
Potassium: 4.7 mmol/L (ref 3.5–5.1)
Sodium: 138 mmol/L (ref 135–145)
Total Bilirubin: 0.9 mg/dL (ref 0.3–1.2)
Total Protein: 6.6 g/dL (ref 6.5–8.1)

## 2022-03-24 LAB — TROPONIN I (HIGH SENSITIVITY)
Troponin I (High Sensitivity): <2 ng/L (ref <18)
Troponin I (High Sensitivity): <2 ng/L (ref <18)

## 2022-03-24 LAB — D-DIMER, QUANTITATIVE: D-Dimer, Quant: <0.27 ug/mL-FEU (ref 0.00–0.50)

## 2022-03-24 LAB — TSH: TSH: 2.606 u[IU]/mL (ref 0.350–4.500)

## 2022-03-24 LAB — CK: Total CK: 45 U/L (ref 38–234)

## 2022-03-24 LAB — BRAIN NATRIURETIC PEPTIDE: B Natriuretic Peptide: 81 pg/mL (ref 0.0–100.0)

## 2022-03-24 MED ORDER — LACTATED RINGERS IV BOLUS
1000.0000 mL | Freq: Once | INTRAVENOUS | Status: AC
  Administered 2022-03-24: 1000 mL via INTRAVENOUS

## 2022-03-24 NOTE — Discharge Instructions (Addendum)
Your lab results were all very reassuring.  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  Continue to follow-up with your primary care doctor for management of your ongoing symptoms.  There may be some long-term medications that you can try which may improve your fatigue.  Return to the emergency department at any time for any new or worsening symptoms of concern.

## 2022-03-24 NOTE — ED Provider Notes (Addendum)
[facility] EMERGENCY DEPARTMENT Provider Note   CSN: [id number] Arrival date & time: 03/24/22  1046     History  Chief Complaint  Patient presents with   Weakness    Shannon Shelton is a 71 y.o. female.  HPI Patient presents for fatigue and generalized weakness.  Medical history includes hypothyroidism, memory loss, HLD.  She states that she was in her normal state of health which included in a active lifestyle 7 weeks ago.  6 weeks ago, she underwent colpocleisis, posterior repair, and cystoscopy for uterovaginal prolapse.  She was treated for UTI with Macrobid 2 weeks later.  She continued to have dysuria and frequency.  She was treated with Bactrim.  She will finish the course of Bactrim that was stopped shortly Cultures were negative.  She has since had resolution of her dysuria.  She endorsed generalized weakness.  She was seen in the ED on 6/20.  Urinalysis at that time showed hematuria without evidence of infection.  She was seen in follow-up 1 week ago.  There was concern of tickborne illness at that time.  Patient states that she has found ticks on her but has not developed any fevers or rashes.    Home Medications Prior to Admission medications   Medication Sig Start Date End Date Taking? Authorizing Provider  estradiol (ESTRACE) 0.1 MG/GM vaginal cream Place 1 Applicatorful vaginally at bedtime. Every other day   Yes [provider]  levothyroxine (SYNTHROID, LEVOTHROID) 112 MCG tablet Take 1 tablet (112 mcg total) by mouth daily. 06/23/13 03/24/22 Yes Zanard, Hinton Dyer, MD  Multiple Vitamins-Minerals (PRESERVISION AREDS 2 PO) Take 1 tablet by mouth in the morning and at bedtime. 10/23/18  Yes [provider]  mirabegron ER (MYRBETRIQ) 25 MG TB24 tablet Take 1 tablet (25 mg total) by mouth daily. Patient not taking: Reported on 03/24/2022 03/10/22 04/09/22  Eber Hong, MD      Allergies    Patient has no known allergies.    Review of Systems   Review of Systems   Constitutional:  Positive for fatigue.  Neurological:  Positive for weakness (Generalized).  All other systems reviewed and are negative.   Physical Exam Updated Vital Signs BP 127/68   Pulse (!) 55   Temp 98.4 F (36.9 C)   Resp 17   Ht 5\' 6"  (1.676 m)   Wt 59.9 kg   SpO2 100%   BMI 21.31 kg/m  Physical Exam Vitals and nursing note reviewed.  Constitutional:      General: She is not in acute distress.    Appearance: Normal appearance. She is well-developed. She is not ill-appearing, toxic-appearing or diaphoretic.  HENT:     Head: Normocephalic and atraumatic.     Right Ear: External ear normal.     Left Ear: External ear normal.     Nose: Nose normal.     Mouth/Throat:     Mouth: Mucous membranes are moist.     Pharynx: Oropharynx is clear.  Eyes:     Extraocular Movements: Extraocular movements intact.     Conjunctiva/sclera: Conjunctivae normal.  Cardiovascular:     Rate and Rhythm: Normal rate and regular rhythm.     Heart sounds: No murmur heard. Pulmonary:     Effort: Pulmonary effort is normal. No respiratory distress.     Breath sounds: Normal breath sounds. No wheezing or rales.  Chest:     Chest wall: No tenderness.  Abdominal:     Palpations: Abdomen is soft.  Tenderness: There is no abdominal tenderness.  Musculoskeletal:        General: No swelling or tenderness.     Cervical back: Normal range of motion and neck supple.     Right lower leg: No edema.     Left lower leg: No edema.  Skin:    General: Skin is warm and dry.     Coloration: Skin is not jaundiced or pale.  Neurological:     General: No focal deficit present.     Mental Status: She is alert and oriented to person, place, and time.     Cranial Nerves: No cranial nerve deficit.     Sensory: No sensory deficit.     Motor: No weakness.     Coordination: Coordination normal.  Psychiatric:        Mood and Affect: Mood is anxious. Affect is tearful.        Speech: Speech normal.         Behavior: Behavior normal. Behavior is cooperative.        Thought Content: Thought content normal.     ED Results / Procedures / Treatments   Labs (all labs ordered are listed, but only abnormal results are displayed) Labs Reviewed  URINALYSIS, ROUTINE W REFLEX MICROSCOPIC - Abnormal; Notable for the following components:      Result Value   Color, Urine STRAW (*)    Hgb urine dipstick SMALL (*)    Ketones, ur 5 (*)    Bacteria, UA RARE (*)    All other components within normal limits  URINE CULTURE  BRAIN NATRIURETIC PEPTIDE  COMPREHENSIVE METABOLIC PANEL  D-DIMER, QUANTITATIVE  CBC WITH DIFFERENTIAL/PLATELET  TSH  CK  TROPONIN I (HIGH SENSITIVITY)  TROPONIN I (HIGH SENSITIVITY)    EKG EKG Interpretation  Date/Time:  Tuesday March 24 2022 11:32:17 EDT Ventricular Rate:  71 PR Interval:  158 QRS Duration: 97 QT Interval:  407 QTC Calculation: 443 R Axis:   52 Text Interpretation: Sinus rhythm Probable left atrial enlargement Baseline wander in lead(s) V1 V3 Confirmed by Gloris Manchester ([id number]) on 03/24/2022 1:52:46 PM  Radiology DG Chest Portable 1 View  Result Date: 03/24/2022 CLINICAL DATA:  Generalized weakness. EXAM: PORTABLE CHEST 1 VIEW COMPARISON:  03/27/2014 FINDINGS: 1231 hours. The lungs are clear without focal pneumonia, edema, pneumothorax or pleural effusion. The cardiopericardial silhouette is within normal limits for size. The visualized bony structures of the thorax are unremarkable. Telemetry leads overlie the chest. IMPRESSION: No active disease. Electronically Signed   By: Kennith Center M.D.   On: 03/24/2022 12:47    Procedures Procedures    Medications Ordered in ED Medications  lactated ringers bolus 1,000 mL (0 mLs Intravenous Stopped 03/24/22 1530)    ED Course/ Medical Decision Making/ A&P                           Medical Decision Making Amount and/or Complexity of Data Reviewed Labs: ordered. Radiology: ordered.   This patient presents  to the ED for concern of fatigue and generalized weakness, this involves an extensive number of treatment options, and is a complaint that carries with it a high risk of complications and morbidity.  The differential diagnosis includes infection, CHF, PE, hypothyroidism, deconditioning, dehydration, polypharmacy, depression   Co morbidities that complicate the patient evaluation  hypothyroidism, memory loss, HLD   Additional history obtained:  Additional history obtained from patient's husband External records from outside source obtained and  reviewed including EMR   Lab Tests:  I Ordered, and personally interpreted labs.  The pertinent results include: All normal findings   Imaging Studies ordered:  I ordered imaging studies including chest x-ray I independently visualized and interpreted imaging which showed no acute findings I agree with the radiologist interpretation   Cardiac Monitoring: / EKG:  The patient was maintained on a cardiac monitor.  I personally viewed and interpreted the cardiac monitored which showed an underlying rhythm of: Sinus rhythm  Problem List / ED Course / Critical interventions / Medication management  Patient is a 71 year old female presenting for 6 weeks of ongoing symptoms of fatigue and generalized weakness.  Timing of symptoms coincided with a recent gynecologic procedure.  Over the 6 weeks, she has had follow-up with her gynecologist and has been treated for UTI on 2 separate occasions.  She reports resolution of her dysuria.  She does feel that her fatigue and generalized weakness is worsening.  Vital signs are normal on arrival.  On exam, patient is well-appearing.  She has no areas of discomfort or tenderness.  She is intermittently tearful when describing her recent symptoms and decreased daily activity levels.  I reviewed EMR of her previous work-ups, including ED visit 2 weeks ago.  There were no previous findings to explain the patient's  symptoms.  Lab work today was obtained.  Results were all very reassuring.  Patient was informed of this.  She would benefit from continued follow-up with her primary care doctor who can manage her over long-term.  I do not feel that there is any acute medical condition that is causing the symptoms.  Symptoms may be attributable, in part, to depression, given her tearfulness in the ED.  Patient was advised to schedule follow-up with her PCP as soon as possible.  She was encouraged to return to the ED anytime for any new or worsening symptoms.  She was discharged in good condition. I ordered medication including IV fluids for hydration Reevaluation of the patient after these medicines showed that the patient stayed the same I have reviewed the patients home medicines and have made adjustments as needed   Social Determinants of Health:  Has access to outpatient care        Final Clinical Impression(s) / ED Diagnoses Final diagnoses:  Generalized weakness  Fatigue, unspecified type    Rx / DC Orders ED Discharge Orders     None          Gloris Manchester, MD 03/24/22 1811

## 2022-03-24 NOTE — ED Triage Notes (Signed)
Patient with complaints of weakness for a month.

## 2022-03-25 LAB — URINE CULTURE: Culture: NO GROWTH

## 2022-04-07 ENCOUNTER — Ambulatory Visit: Payer: Medicare Other | Admitting: Family Medicine

## 2022-05-16 ENCOUNTER — Encounter (HOSPITAL_COMMUNITY): Payer: Self-pay | Admitting: Emergency Medicine

## 2022-05-16 ENCOUNTER — Emergency Department (HOSPITAL_COMMUNITY)
Admission: EM | Admit: 2022-05-16 | Discharge: 2022-05-16 | Disposition: A | Discharge status: Home / Self Care | Payer: Medicare Other | Attending: Emergency Medicine | Admitting: Emergency Medicine

## 2022-05-16 ENCOUNTER — Other Ambulatory Visit: Payer: Self-pay

## 2022-05-16 DIAGNOSIS — R531 Weakness: Secondary | ICD-10-CM | POA: Diagnosis not present

## 2022-05-16 DIAGNOSIS — Z79899 Other long term (current) drug therapy: Secondary | ICD-10-CM | POA: Insufficient documentation

## 2022-05-16 DIAGNOSIS — R5383 Other fatigue: Secondary | ICD-10-CM

## 2022-05-16 DIAGNOSIS — R4584 Anhedonia: Secondary | ICD-10-CM | POA: Diagnosis not present

## 2022-05-16 DIAGNOSIS — E039 Hypothyroidism, unspecified: Secondary | ICD-10-CM | POA: Insufficient documentation

## 2022-05-16 LAB — URINALYSIS, ROUTINE W REFLEX MICROSCOPIC
Bacteria, UA: NONE SEEN
Bilirubin Urine: NEGATIVE
Glucose, UA: NEGATIVE mg/dL
Ketones, ur: NEGATIVE mg/dL
Leukocytes,Ua: NEGATIVE
Nitrite: NEGATIVE
Protein, ur: NEGATIVE mg/dL
Specific Gravity, Urine: 1.003 — ABNORMAL LOW (ref 1.005–1.030)
pH: 6 (ref 5.0–8.0)

## 2022-05-16 LAB — BASIC METABOLIC PANEL
Anion gap: 7 (ref 5–15)
BUN: 8 mg/dL (ref 8–23)
CO2: 26 mmol/L (ref 22–32)
Calcium: 9.1 mg/dL (ref 8.9–10.3)
Chloride: 106 mmol/L (ref 98–111)
Creatinine, Ser: 0.7 mg/dL (ref 0.44–1.00)
GFR, Estimated: >60 mL/min (ref >60)
Glucose, Bld: 103 mg/dL — ABNORMAL HIGH (ref 70–99)
Potassium: 4.1 mmol/L (ref 3.5–5.1)
Sodium: 139 mmol/L (ref 135–145)

## 2022-05-16 LAB — CBC
HCT: 39 % (ref 36.0–46.0)
Hemoglobin: 12.8 g/dL (ref 12.0–15.0)
MCH: 30.4 pg (ref 26.0–34.0)
MCHC: 32.8 g/dL (ref 30.0–36.0)
MCV: 92.6 fL (ref 80.0–100.0)
Platelets: 234 10*3/uL (ref 150–400)
RBC: 4.21 MIL/uL (ref 3.87–5.11)
RDW: 13.2 % (ref 11.5–15.5)
WBC: 6.1 10*3/uL (ref 4.0–10.5)
nRBC: 0 % (ref 0.0–0.2)

## 2022-05-16 NOTE — ED Provider Notes (Signed)
[facility] EMERGENCY DEPARTMENT Provider Note   CSN: [id number] Arrival date & time: 05/16/22  1148     History  Chief Complaint  Patient presents with   Weakness    Shannon Shelton is a 71 y.o. female with past medical history of hypothyroidism for which he takes Synthroid, supplemental hormone medications including estradiol and progesterone who presents the emergency department with acute on chronic generalized weakness and fatigue.  Patient states that ever since her recent uterine prolapse surgery within the past several months she has had generalized fatigue that has gradually worsened.  She describes generalized nonfocal weakness, lack of energy.  She has associated appetite change, some degree of anhedonia, difficulty concentrating although no feelings of sadness or suicidal ideation.  Aside of generalized weakness, the patient denies any other new symptoms at this time.  She reiterates that her symptoms been going on for several months and have slowly progressively worsened.    Weakness Associated symptoms: no abdominal pain, no arthralgias, no chest pain, no cough, no dysuria, no fever, no seizures, no shortness of breath and no vomiting       Past Medical History:  Diagnosis Date   Hyperlipidemia    Rheumatic fever    Thyroid disease    hyperlipidemia    Home Medications Prior to Admission medications   Medication Sig Start Date End Date Taking? Authorizing Provider  estradiol (ESTRACE) 0.1 MG/GM vaginal cream Place 1 Applicatorful vaginally at bedtime. Every other day    [provider]  levothyroxine (SYNTHROID, LEVOTHROID) 112 MCG tablet Take 1 tablet (112 mcg total) by mouth daily. 06/23/13 03/24/22  Gillian Scarce, MD  mirabegron ER (MYRBETRIQ) 25 MG TB24 tablet Take 1 tablet (25 mg total) by mouth daily. Patient not taking: Reported on 03/24/2022 03/10/22 04/09/22  Eber Hong, MD  Multiple Vitamins-Minerals (PRESERVISION AREDS 2 PO) Take  1 tablet by mouth in the morning and at bedtime. 10/23/18   [provider]      Allergies    Patient has no known allergies.    Review of Systems   Review of Systems  Constitutional:  Positive for appetite change and fatigue. Negative for chills and fever.  HENT:  Negative for ear pain and sore throat.   Eyes:  Negative for pain and visual disturbance.  Respiratory:  Negative for cough and shortness of breath.   Cardiovascular:  Negative for chest pain and palpitations.  Gastrointestinal:  Negative for abdominal pain and vomiting.  Genitourinary:  Negative for dysuria and hematuria.  Musculoskeletal:  Negative for arthralgias and back pain.  Skin:  Negative for color change and rash.  Neurological:  Negative for seizures, syncope and weakness.  All other systems reviewed and are negative.   Physical Exam Updated Vital Signs BP 138/73 (BP Location: Right Arm)   Pulse 66   Temp 99.2 F (37.3 C)   Resp 16   SpO2 100%  Physical Exam Vitals and nursing note reviewed.  Constitutional:      General: She is not in acute distress.    Appearance: She is well-developed.     Comments: Well-appearing intermittently tearful 71 year old female.  HENT:     Head: Normocephalic and atraumatic.  Eyes:     Conjunctiva/sclera: Conjunctivae normal.  Cardiovascular:     Rate and Rhythm: Normal rate and regular rhythm.     Heart sounds: No murmur heard. Pulmonary:     Effort: Pulmonary effort is normal. No respiratory distress.     Breath  sounds: Normal breath sounds.  Abdominal:     Palpations: Abdomen is soft.     Tenderness: There is no abdominal tenderness.  Musculoskeletal:        General: No swelling.     Cervical back: Neck supple.  Skin:    General: Skin is warm and dry.     Capillary Refill: Capillary refill takes less than 2 seconds.  Neurological:     Mental Status: She is alert.     Comments: Patient is fully alert and oriented moving all extremity spontaneously.   Cranial nerves II through XII grossly intact.  Grip strength symmetric bilaterally.  Hip flexion 5-5 bilaterally.  Sensation is symmetric in the upper and lower extremities.  I have personally ambulated the patient.  She ambulated that difficulty and independently.  Psychiatric:        Mood and Affect: Mood normal.     Comments: Patient makes good eye contact has linear thought process although is intermittently tearful.  Depressed mood     ED Results / Procedures / Treatments   Labs (all labs ordered are listed, but only abnormal results are displayed) Labs Reviewed  BASIC METABOLIC PANEL - Abnormal; Notable for the following components:      Result Value   Glucose, Bld 103 (*)    All other components within normal limits  URINALYSIS, ROUTINE W REFLEX MICROSCOPIC - Abnormal; Notable for the following components:   Color, Urine STRAW (*)    Specific Gravity, Urine 1.003 (*)    Hgb urine dipstick MODERATE (*)    All other components within normal limits  CBC    EKG EKG Interpretation  Date/Time:  Saturday May 16 2022 12:06:47 EDT Ventricular Rate:  79 PR Interval:  150 QRS Duration: 86 QT Interval:  394 QTC Calculation: 451 R Axis:   68 Text Interpretation: Normal sinus rhythm Cannot rule out Anterior infarct , age undetermined Abnormal ECG When compared with ECG of 24-Mar-2022 11:32, PREVIOUS ECG IS PRESENT since last tracing no significant change Confirmed by Eber Hong ([id number]) on 05/16/2022 5:55:33 PM  Radiology No results found.  Procedures Procedures    Medications Ordered in ED Medications - No data to display  ED Course/ Medical Decision Making/ A&P                           Medical Decision Making Amount and/or Complexity of Data Reviewed Labs: ordered.   The patient presents the emergency department hemodynamically stable, afebrile and with a nonfocal reassuring physical exam as above in the setting of 3 months of generalized slowly progressive  fatigue.  Differential diagnosis includes depressive disorder versus less likely thyroid disease in the setting of recent normal thyroid labs and no recent change to thyroid medication regimen versus less likely electrolyte abnormality or anemia in the setting of normal recent labs.  I have personally reviewed and interpreted the patient's EKG which reveals normal sinus rhythm without any ischemic changes but compared to EKG performed 03-10-2022.  I have personally reviewed and interpreted the patient's laboratory work-up today which is grossly unremarkable.  Patient was informed of normal work-up and instructed to follow-up with her primary care physician to discuss initiation on antidepressant medication.  Patient verbalized agreement and understanding with this plan.  The patient was discharged home in stable condition        Final Clinical Impression(s) / ED Diagnoses Final diagnoses:  Other fatigue    Rx / DC Orders  ED Discharge Orders     None         Duard Brady, MD 05/16/22 Leitha Schuller, MD 05/17/22 2126

## 2022-05-16 NOTE — ED Triage Notes (Signed)
Patient from home with complaints of generalized weakness, malaise, nausea since surgery 3 months ago for uterine prolapse. Patient felt better but now feeling worse again since having a cystoscopy 3 weeks ago. Aox4. Patient denies any urinary issues at this time.

## 2022-05-16 NOTE — Discharge Instructions (Addendum)
You were seen in the emergency department today for several months of progressive fatigue.  Your blood work looked good today.  I have also reviewed your blood work that you have gotten in the recent past which also looks good.  We think you are safe to go home at this time.  As discussed, we would like you to follow-up with your regular doctor to discuss potentially starting an antidepressant medication.  If your symptoms suddenly worsen or change please come back to the emergency department.

## 2022-05-16 NOTE — ED Notes (Signed)
Patient verbalizes understanding of d/c instructions. Opportunities for questions and answers were provided. Pt d/c from ED and ambulated to lobby.  

## 2022-05-22 ENCOUNTER — Encounter: Payer: Self-pay | Admitting: Cardiology

## 2022-05-22 ENCOUNTER — Ambulatory Visit: Payer: Medicare Other | Attending: Cardiology | Admitting: Cardiology

## 2022-05-22 VITALS — BP 140/74 | HR 76 | Ht 66.0 in | Wt 134.6 lb

## 2022-05-22 DIAGNOSIS — R072 Precordial pain: Secondary | ICD-10-CM | POA: Diagnosis not present

## 2022-05-22 DIAGNOSIS — E785 Hyperlipidemia, unspecified: Secondary | ICD-10-CM | POA: Diagnosis not present

## 2022-05-22 DIAGNOSIS — R0609 Other forms of dyspnea: Secondary | ICD-10-CM | POA: Diagnosis not present

## 2022-05-22 DIAGNOSIS — R079 Chest pain, unspecified: Secondary | ICD-10-CM | POA: Diagnosis not present

## 2022-05-22 MED ORDER — METOPROLOL TARTRATE 100 MG PO TABS: ORAL_TABLET | ORAL | 0 refills | Status: DC

## 2022-05-22 NOTE — Progress Notes (Signed)
Cardiology Office Note:    Date:  05/22/2022   ID:  Shannon Shelton, DOB 07-Jan-1951, MRN [id number]  PCP:  Lauretta Grill, FNP  Cardiologist:  None  Electrophysiologist:  None   Referring MD: Lauretta Grill, FNP   Chief Complaint  Patient presents with   Chest Pain    History of Present Illness:    Shannon Shelton is a 71 y.o. female with a hx of hypothyroidism, hyperlipidemia, rheumatic fever who is referred by Lauretta Grill, NP for evaluation of chest pain.  She reports she has been feeling discomfort in chest 1-2 times per day, last for about 20 seconds.  Describes as pressure in chest, seems to occur randomly.  She does go for walks and notes fatigue and shortness of breath.  Has been having lightheadedness but denies any syncope.  No lower extremity edema.  No smoking history.  No history of heart disease in her family.  She had rheumatic fever as a child, reports no cardiac involvement.    Past Medical History:  Diagnosis Date   Hyperlipidemia    Rheumatic fever    Thyroid disease    hyperlipidemia    Past Surgical History:  Procedure Laterality Date   PROLAPSED UTERINE FIBROID LIGATION      Current Medications: Current Meds  Medication Sig   citalopram (CELEXA) 10 MG tablet Take 10 mg by mouth daily.   levothyroxine (SYNTHROID, LEVOTHROID) 112 MCG tablet Take 1 tablet (112 mcg total) by mouth daily.   metoprolol tartrate (LOPRESSOR) 100 MG tablet Take 1 tablet (100mg ) TWO hours prior to CT scan   Multiple Vitamins-Minerals (PRESERVISION AREDS 2 PO) Take 1 tablet by mouth in the morning and at bedtime.     Allergies:   Sulfamethoxazole-trimethoprim   Social History   Socioeconomic History   Marital status: Married    Spouse name:   Number of children: 2   Years of education: 14   Highest education level: Not on file  Occupational History   Occupation: Retired  Tobacco Use   Smoking status: Never   Smokeless tobacco: Never  Substance and Sexual  Activity   Alcohol use: Yes    Comment: occasionally   Drug use: No   Sexual activity: Yes  Other Topics Concern   Not on file  Social History Narrative   Marital Status: Married Shannon Shelton)    Children:  Sons (2)    Pets: Dogs (2)    Living Situation: Lives with  husband   Occupation:  Retired (Real estate)    Education: Radiation protection practitioner    Tobacco Use/Exposure:  None    Alcohol Use:  Occasional   Drug Use:  None   Diet:  Regular   Exercise:  Walking   Hobbies: Yard Work/Photography            Social Determinants of Engineer, maintenance (IT) Strain: Not on file  Food Insecurity: Not on file  Transportation Needs: Not on file  Physical Activity: Not on file  Stress: Not on file  Social Connections: Not on file     Family History: The patient's family history includes COPD in her father; Cancer in her cousin and maternal grandmother; Heart disease in her father.  ROS:   Please see the history of present illness.     All other systems reviewed and are negative.  EKGs/Labs/Other Studies Reviewed:    The following studies were reviewed today:   EKG:   05/22/2022: Normal sinus rhythm, rate 76, no ST  abnormalities  Recent Labs: 03/10/2022: Magnesium 1.9 03/24/2022: ALT 12; B Natriuretic Peptide 81.0; TSH 2.606 05/16/2022: BUN 8; Creatinine, Ser 0.70; Hemoglobin 12.8; Platelets 234; Potassium 4.1; Sodium 139  Recent Lipid Panel No results found for: "CHOL", "TRIG", "HDL", "CHOLHDL", "VLDL", "LDLCALC", "LDLDIRECT"  Physical Exam:    VS:  BP (!) 140/74 (BP Location: Left Arm, Patient Position: Sitting, Cuff Size: Normal)   Pulse 76   Ht 5\' 6"  (1.676 m)   Wt 134 lb 9.6 oz (61.1 kg)   SpO2 96%   BMI 21.73 kg/m     Wt Readings from Last 3 Encounters:  05/22/22 134 lb 9.6 oz (61.1 kg)  03/24/22 132 lb (59.9 kg)  03/10/22 132 lb (59.9 kg)     GEN:  Well nourished, well developed in no acute distress HEENT: Normal NECK: No JVD; No carotid bruits LYMPHATICS: No  lymphadenopathy CARDIAC: RRR, no murmurs, rubs, gallops RESPIRATORY:  Clear to auscultation without rales, wheezing or rhonchi  ABDOMEN: Soft, non-tender, non-distended MUSCULOSKELETAL:  No edema; No deformity  SKIN: Warm and dry NEUROLOGIC:  Alert and oriented x 3 PSYCHIATRIC:  Normal affect   ASSESSMENT:    1. Chest pain of uncertain etiology   2. Hyperlipidemia, unspecified hyperlipidemia type   3. DOE (dyspnea on exertion)   4. Precordial pain    PLAN:    Chest pain/DOE: Atypical in description but also with dyspnea on exertion that could represent anginal equivalent.  Does have CAD risk factors (age, hyperlipidemia).  Recommend coronary CTA to rule out obstructive CAD.  Will give Lopressor 100 mg prior to study.  Check echocardiogram to rule out structural heart disease.  Hyperlipidemia: Reported history, no recently lipid panel.  Will check lipids  RTC in 6 months   Medication Adjustments/Labs and Tests Ordered: Current medicines are reviewed at length with the patient today.  Concerns regarding medicines are outlined above.  Orders Placed This Encounter  Procedures   CT CORONARY MORPH W/CTA COR W/SCORE W/CA W/CM &/OR WO/CM   Basic metabolic panel   Lipid panel   EKG 12-Lead   ECHOCARDIOGRAM COMPLETE   Meds ordered this encounter  Medications   metoprolol tartrate (LOPRESSOR) 100 MG tablet    Sig: Take 1 tablet (100mg ) TWO hours prior to CT scan    Dispense:  1 tablet    Refill:  0    Patient Instructions  Medication Instructions:  Your physician recommends that you continue on your current medications as directed. Please refer to the Current Medication list given to you today.  Take metoprolol (Lopressor) 100 mg TWO hours prior to CT scan  *If you need a refill on your cardiac medications before your next appointment, please call your pharmacy*   Lab Work: BMET, Lipid today  If you have labs (blood work) drawn today and your tests are completely normal,  you will receive your results only by: MyChart Message (if you have MyChart) OR A paper copy in the mail If you have any lab test that is abnormal or we need to change your treatment, we will call you to review the results.   Testing/Procedures: Coronary CTA-see instructions below  Your physician has requested that you have an echocardiogram. Echocardiography is a painless test that uses sound waves to create images of your heart. It provides your doctor with information about the size and shape of your heart and how well your heart's chambers and valves are working. This procedure takes approximately one hour. There are no restrictions for this  procedure.   Follow-Up: At [facility], you and your health needs are our priority.  As part of our continuing mission to provide you with exceptional heart care, we have created designated Provider Care Teams.  These Care Teams include your primary Cardiologist (physician) and Advanced Practice Providers (APPs -  Physician Assistants and Nurse Practitioners) who all work together to provide you with the care you need, when you need it.  We recommend signing up for the patient portal called "MyChart".  Sign up information is provided on this After Visit Summary.  MyChart is used to connect with patients for Virtual Visits (Telemedicine).  Patients are able to view lab/test results, encounter notes, upcoming appointments, etc.  Non-urgent messages can be sent to your provider as well.   To learn more about what you can do with MyChart, go to [email or web address].    Your next appointment:   6 month(s)  The format for your next appointment:   In Person  Provider:   Dr. Bjorn Pippin Other Instructions   Your cardiac CT will be scheduled at one of the below locations:   [facility] [address] [place], [place] [address] [phone]  If scheduled at [facility], please arrive at the [facility] and Children's  Entrance (Entrance C2) of [facility] 30 minutes prior to test start time. You can use the FREE valet parking offered at entrance C (encouraged to control the heart rate for the test)  Proceed to the [facility] Radiology Department (first floor) to check-in and test prep.  All radiology patients and guests should use entrance C2 at [facility], accessed from [facility], even though the hospital's physical address listed is [address].     Please follow these instructions carefully (unless otherwise directed):  On the Night Before the Test: Be sure to Drink plenty of water. Do not consume any caffeinated/decaffeinated beverages or chocolate 12 hours prior to your test. Do not take any antihistamines 12 hours prior to your test.  On the Day of the Test: Drink plenty of water until 1 hour prior to the test. Do not eat any food 4 hours prior to the test. You may take your regular medications prior to the test.  Take metoprolol (Lopressor) two hours prior to test. FEMALES- please wear underwire-free bra if available, avoid dresses & tight clothing  After the Test: Drink plenty of water. After receiving IV contrast, you may experience a mild flushed feeling. This is normal. On occasion, you may experience a mild rash up to 24 hours after the test. This is not dangerous. If this occurs, you can take Benadryl 25 mg and increase your fluid intake. If you experience trouble breathing, this can be serious. If it is severe call 911 IMMEDIATELY. If it is mild, please call our office. If you take any of these medications: Glipizide/Metformin, Avandament, Glucavance, please do not take 48 hours after completing test unless otherwise instructed.  We will call to schedule your test 2-4 weeks out understanding that some insurance companies will need an authorization prior to the service being performed.   For non-scheduling related questions, please contact the  cardiac imaging nurse navigator should you have any questions/concerns: Rockwell Alexandria, Cardiac Imaging Nurse Navigator Larey Brick, Cardiac Imaging Nurse Navigator [facility] Direct Office Dial: [phone]   For scheduling needs, including cancellations and rescheduling, please call [place], [phone].  Signed, Little Ishikawa, MD  05/22/2022 4:30 PM    [facility]

## 2022-05-22 NOTE — Patient Instructions (Signed)
Medication Instructions:  Your physician recommends that you continue on your current medications as directed. Please refer to the Current Medication list given to you today.  Take metoprolol (Lopressor) 100 mg TWO hours prior to CT scan  *If you need a refill on your cardiac medications before your next appointment, please call your pharmacy*   Lab Work: BMET, Lipid today  If you have labs (blood work) drawn today and your tests are completely normal, you will receive your results only by: MyChart Message (if you have MyChart) OR A paper copy in the mail If you have any lab test that is abnormal or we need to change your treatment, we will call you to review the results.   Testing/Procedures: Coronary CTA-see instructions below  Your physician has requested that you have an echocardiogram. Echocardiography is a painless test that uses sound waves to create images of your heart. It provides your doctor with information about the size and shape of your heart and how well your heart's chambers and valves are working. This procedure takes approximately one hour. There are no restrictions for this procedure.   Follow-Up: At [facility], you and your health needs are our priority.  As part of our continuing mission to provide you with exceptional heart care, we have created designated Provider Care Teams.  These Care Teams include your primary Cardiologist (physician) and Advanced Practice Providers (APPs -  Physician Assistants and Nurse Practitioners) who all work together to provide you with the care you need, when you need it.  We recommend signing up for the patient portal called "MyChart".  Sign up information is provided on this After Visit Summary.  MyChart is used to connect with patients for Virtual Visits (Telemedicine).  Patients are able to view lab/test results, encounter notes, upcoming appointments, etc.  Non-urgent messages can be sent to your provider as well.   To  learn more about what you can do with MyChart, go to [email or web address].    Your next appointment:   6 month(s)  The format for your next appointment:   In Person  Provider:   Dr. Bjorn Pippin Other Instructions   Your cardiac CT will be scheduled at one of the below locations:   [facility] [address] [place], [place] [address] [phone]  If scheduled at [facility], please arrive at the [facility] and Children's Entrance (Entrance C2) of [facility] 30 minutes prior to test start time. You can use the FREE valet parking offered at entrance C (encouraged to control the heart rate for the test)  Proceed to the [facility] Radiology Department (first floor) to check-in and test prep.  All radiology patients and guests should use entrance C2 at [facility], accessed from [facility], even though the hospital's physical address listed is [address]     Please follow these instructions carefully (unless otherwise directed):  On the Night Before the Test: Be sure to Drink plenty of water. Do not consume any caffeinated/decaffeinated beverages or chocolate 12 hours prior to your test. Do not take any antihistamines 12 hours prior to your test.  On the Day of the Test: Drink plenty of water until 1 hour prior to the test. Do not eat any food 4 hours prior to the test. You may take your regular medications prior to the test.  Take metoprolol (Lopressor) two hours prior to test. FEMALES- please wear underwire-free bra if available, avoid dresses & tight clothing  After the Test: Drink plenty of water. After receiving IV contrast, you may experience a mild flushed feeling. This is normal. On occasion, you may experience a mild rash up to 24 hours after the test. This is not dangerous. If this occurs, you can take Benadryl 25 mg and increase your fluid intake. If you experience trouble breathing, this can be  serious. If it is severe call 911 IMMEDIATELY. If it is mild, please call our office. If you take any of these medications: Glipizide/Metformin, Avandament, Glucavance, please do not take 48 hours after completing test unless otherwise instructed.  We will call to schedule your test 2-4 weeks out understanding that some insurance companies will need an authorization prior to the service being performed.   For non-scheduling related questions, please contact the cardiac imaging nurse navigator should you have any questions/concerns: Rockwell Alexandria, Cardiac Imaging Nurse Navigator Larey Brick, Cardiac Imaging Nurse Navigator [facility] Direct Office Dial: [phone]   For scheduling needs, including cancellations and rescheduling, please call [place], [phone].

## 2022-05-23 LAB — BASIC METABOLIC PANEL
BUN/Creatinine Ratio: 17 (ref 12–28)
BUN: 11 mg/dL (ref 8–27)
CO2: 24 mmol/L (ref 20–29)
Calcium: 9.3 mg/dL (ref 8.7–10.3)
Chloride: 100 mmol/L (ref 96–106)
Creatinine, Ser: 0.65 mg/dL (ref 0.57–1.00)
Glucose: 82 mg/dL (ref 70–99)
Potassium: 4.9 mmol/L (ref 3.5–5.2)
Sodium: 138 mmol/L (ref 134–144)
eGFR: 95 mL/min/{1.73_m2} (ref >59)

## 2022-05-23 LAB — LIPID PANEL
Chol/HDL Ratio: 3 ratio (ref 0.0–4.4)
Cholesterol, Total: 212 mg/dL — ABNORMAL HIGH (ref 100–199)
HDL: 71 mg/dL (ref >39)
LDL Chol Calc (NIH): 129 mg/dL — ABNORMAL HIGH (ref 0–99)
Triglycerides: 65 mg/dL (ref 0–149)
VLDL Cholesterol Cal: 12 mg/dL (ref 5–40)

## 2022-05-27 ENCOUNTER — Ambulatory Visit: Payer: Medicare Other | Admitting: Cardiology

## 2022-05-29 ENCOUNTER — Ambulatory Visit (HOSPITAL_COMMUNITY)
Admission: RE | Admit: 2022-05-29 | Discharge: 2022-05-29 | Disposition: A | Discharge status: Home / Self Care | Payer: Medicare Other | Source: Ambulatory Visit | Attending: Cardiology | Admitting: Cardiology

## 2022-05-29 DIAGNOSIS — R079 Chest pain, unspecified: Secondary | ICD-10-CM | POA: Diagnosis present

## 2022-05-29 LAB — ECHOCARDIOGRAM COMPLETE
Area-P 1/2: 2.87 cm2
S' Lateral: 2.2 cm

## 2022-05-29 NOTE — Progress Notes (Signed)
*  PRELIMINARY RESULTS* Echocardiogram 2D Echocardiogram has been performed.  Stacey Drain 05/29/2022, 2:03 PM

## 2022-06-10 ENCOUNTER — Telehealth (HOSPITAL_COMMUNITY): Payer: Self-pay | Admitting: *Deleted

## 2022-06-10 NOTE — Telephone Encounter (Signed)
Reaching out to patient to offer assistance regarding upcoming cardiac imaging study; pt verbalizes understanding of appt date/time, parking situation and where to check in,  and medications ordered, and verified current allergies; name and call back number provided for further questions should they arise  Shannon Clement RN Navigator Cardiac Imaging [facility] [phone] office [phone] cell  Patient to take 100mg  metoprolol tartrate two hours prior to his cardiac CT scan.  He is aware to arrive at 2pm.

## 2022-06-11 ENCOUNTER — Ambulatory Visit (HOSPITAL_COMMUNITY)
Admission: RE | Admit: 2022-06-11 | Discharge: 2022-06-11 | Disposition: A | Discharge status: Home / Self Care | Payer: Medicare Other | Source: Ambulatory Visit | Attending: Cardiology | Admitting: Cardiology

## 2022-06-11 DIAGNOSIS — R0609 Other forms of dyspnea: Secondary | ICD-10-CM | POA: Diagnosis present

## 2022-06-11 DIAGNOSIS — R079 Chest pain, unspecified: Secondary | ICD-10-CM | POA: Diagnosis not present

## 2022-06-11 DIAGNOSIS — R072 Precordial pain: Secondary | ICD-10-CM | POA: Diagnosis present

## 2022-06-11 MED ORDER — IOHEXOL 350 MG/ML SOLN
100.0000 mL | Freq: Once | INTRAVENOUS | Status: AC | PRN
  Administered 2022-06-11: 100 mL via INTRAVENOUS

## 2022-06-11 MED ORDER — NITROGLYCERIN 0.4 MG SL SUBL
0.8000 mg | SUBLINGUAL_TABLET | Freq: Once | SUBLINGUAL | Status: AC
  Administered 2022-06-11: 0.8 mg via SUBLINGUAL

## 2022-06-11 MED ORDER — NITROGLYCERIN 0.4 MG SL SUBL
SUBLINGUAL_TABLET | SUBLINGUAL | Status: AC
  Filled 2022-06-11: qty 2

## 2022-12-08 ENCOUNTER — Telehealth: Payer: Self-pay | Admitting: Family Medicine

## 2022-12-08 NOTE — Telephone Encounter (Signed)
Left message to return call for scheduling initial visit with Dr. Dennard Schaumann as per his staff message.

## 2023-01-14 ENCOUNTER — Ambulatory Visit (INDEPENDENT_AMBULATORY_CARE_PROVIDER_SITE_OTHER): Payer: Medicare Other | Admitting: Family Medicine

## 2023-01-14 ENCOUNTER — Encounter: Payer: Self-pay | Admitting: Family Medicine

## 2023-01-14 VITALS — BP 120/76 | HR 73 | Temp 99.2°F | Ht 66.0 in | Wt 134.4 lb

## 2023-01-14 DIAGNOSIS — E039 Hypothyroidism, unspecified: Secondary | ICD-10-CM | POA: Diagnosis not present

## 2023-01-14 DIAGNOSIS — Z7689 Persons encountering health services in other specified circumstances: Secondary | ICD-10-CM

## 2023-01-14 NOTE — Progress Notes (Signed)
Subjective:    Patient ID: Shannon Shelton, female    DOB: 18-Feb-1951, 72 y.o.   MRN: [id number]  HPI Patient is a very pleasant 72 year old Caucasian female here today to establish care with me.  Past medical history significant for hypothyroidism.  She is currently on Celexa due to fatigue.  Approximately 6 months ago, after she suffered from a severe bladder infection, she developed profound fatigue.  They could not isolate the source of the empirically for possible depression.  She does not feel like she is depressed.  The fatigue has improved.  She is interested in weaning off the Celexa.  She denies any anxiety or anhedonia or suicidal thoughts.  I recommended 10 mg a day for 2 weeks and then 10 mg every other day for 2 weeks.  Aside from that, the patient has a remote history of rheumatic fever although she had an echocardiogram in September of last year that showed no significant valve damage.  Her last mammogram was in June 2023 and was normal.  She believes she had a colonoscopy less than 5 years ago but they did not see a polyp and they recommended a repeat colonoscopy in 5 years.  Due to her age she does not require Pap smear.  Her last bone density was 2 years ago and showed osteopenia.  I will repeat bone density test next year.  She is not currently taking calcium or vitamin D.  Previous surgery includes repair of a prolapsed uterus.  Outside of that her medical history is noncontributory.  Her vaccinations are up-to-date except for tetanus shot, COVID shot, and an RSV shot. Past Medical History:  Diagnosis Date   Hyperlipidemia    Rheumatic fever    Thyroid disease    hyperlipidemia   Past Surgical History:  Procedure Laterality Date   PROLAPSED UTERINE FIBROID LIGATION     Current Outpatient Medications on File Prior to Visit  Medication Sig Dispense Refill   citalopram (CELEXA) 10 MG tablet Take 10 mg by mouth daily.     estradiol (ESTRACE) 0.1 MG/GM vaginal cream Place 1  Applicatorful vaginally at bedtime. Every other day     levothyroxine (SYNTHROID, LEVOTHROID) 112 MCG tablet Take 1 tablet (112 mcg total) by mouth daily. 90 tablet 3   Multiple Vitamins-Minerals (PRESERVISION AREDS 2 PO) Take 1 tablet by mouth in the morning and at bedtime.     No current facility-administered medications on file prior to visit.   Allergies  Allergen Reactions   Sulfamethoxazole-Trimethoprim Other (See Comments)    HA, numbness in hands, pain in legs.    Social History   Socioeconomic History   Marital status: Married    Spouse name: Peyton Najjar   Number of children: 2   Years of education: 14   Highest education level: Not on file  Occupational History   Occupation: Retired  Tobacco Use   Smoking status: Never   Smokeless tobacco: Never  Substance and Sexual Activity   Alcohol use: Yes    Comment: occasionally   Drug use: No   Sexual activity: Yes  Other Topics Concern   Not on file  Social History Narrative   Marital Status: Married Radiation protection practitioner)    Children:  Sons (2)    Pets: Dogs (2)    Living Situation: Lives with  husband   Occupation:  Retired (Real estate)    Education: Engineer, maintenance (IT)    Tobacco Use/Exposure:  None    Alcohol Use:  Occasional   Drug  Use:  None   Diet:  Regular   Exercise:  Walking   Hobbies: Yard Work/Photography            Social Determinants of Corporate investment banker Strain: Not on file  Food Insecurity: Not on file  Transportation Needs: Not on file  Physical Activity: Not on file  Stress: Not on file  Social Connections: Not on file  Intimate Partner Violence: Not on file   Family History  Problem Relation Age of Onset   Heart disease Father    COPD Father        He was a smoker    Cancer Maternal Grandmother        Colon Cancer   Cancer Maternal Grandfather    Cancer Cousin        Brain Tumor       Review of Systems  All other systems reviewed and are negative.      Objective:   Physical  Exam Vitals reviewed.  Constitutional:      General: She is not in acute distress.    Appearance: Normal appearance. She is normal weight. She is not ill-appearing, toxic-appearing or diaphoretic.  HENT:     Head: Normocephalic and atraumatic.     Right Ear: Tympanic membrane and ear canal normal.     Left Ear: Tympanic membrane and ear canal normal.     Nose: Nose normal. No congestion or rhinorrhea.     Mouth/Throat:     Mouth: Mucous membranes are moist.     Pharynx: Oropharynx is clear. No oropharyngeal exudate or posterior oropharyngeal erythema.  Eyes:     Extraocular Movements: Extraocular movements intact.     Conjunctiva/sclera: Conjunctivae normal.     Pupils: Pupils are equal, round, and reactive to light.  Neck:     Vascular: No carotid bruit.  Cardiovascular:     Rate and Rhythm: Normal rate and regular rhythm.     Pulses: Normal pulses.     Heart sounds: Normal heart sounds.  Pulmonary:     Effort: Pulmonary effort is normal. No respiratory distress.     Breath sounds: No wheezing, rhonchi or rales.  Abdominal:     General: Abdomen is flat. Bowel sounds are normal. There is no distension.     Palpations: Abdomen is soft.     Tenderness: There is no abdominal tenderness. There is no guarding or rebound.  Musculoskeletal:        General: No deformity.     Cervical back: Normal range of motion and neck supple. No rigidity or tenderness.     Right lower leg: Edema present.     Left lower leg: Edema present.  Lymphadenopathy:     Cervical: No cervical adenopathy.  Skin:    Coloration: Skin is not jaundiced or pale.     Findings: No bruising, erythema, lesion or rash.  Neurological:     General: No focal deficit present.     Mental Status: She is alert and oriented to person, place, and time. Mental status is at baseline.     Cranial Nerves: No cranial nerve deficit.     Sensory: No sensory deficit.     Motor: No weakness.     Coordination: Coordination normal.      Gait: Gait normal.     Deep Tendon Reflexes: Reflexes normal.  Psychiatric:        Mood and Affect: Mood normal.        Behavior: Behavior normal.  Thought Content: Thought content normal.        Judgment: Judgment normal.    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  She recently received an injection for macular degeneration.       Assessment & Plan:   Hypothyroidism, unspecified type - Plan: CBC with Differential/Platelet, Lipid panel, COMPLETE METABOLIC PANEL WITH GFR, TSH  Establishing care with new doctor, encounter for Physical exam today is completely normal.  I will check a CBC a CMP a lipid panel and a TSH.  I recommended checking a TSH every 6 months.  Patient is due for COVID booster and an RSV shot.  We discussed these and I think the patient is at very low risk for either infection.  She is due for a tetanus shot but she defers that today.  Her mammogram is due in June.  She is not certain of her colonoscopy.  Her Pap smear is not required.  I will check a bone density next year.  I did recommend calcium 1200 mg a day and vitamin D 1000 units a day.  The remainder of her preventative care is up-to-date

## 2023-01-15 LAB — CBC WITH DIFFERENTIAL/PLATELET
Absolute Monocytes: 427 cells/uL (ref 200–950)
Basophils Absolute: 87 cells/uL (ref 0–200)
Basophils Relative: 1.1 %
Eosinophils Absolute: 498 cells/uL (ref 15–500)
Eosinophils Relative: 6.3 %
HCT: 37.5 % (ref 35.0–45.0)
Hemoglobin: 12.5 g/dL (ref 11.7–15.5)
Lymphs Abs: 2283 cells/uL (ref 850–3900)
MCH: 30 pg (ref 27.0–33.0)
MCHC: 33.3 g/dL (ref 32.0–36.0)
MCV: 90.1 fL (ref 80.0–100.0)
MPV: 10.5 fL (ref 7.5–12.5)
Monocytes Relative: 5.4 %
Neutro Abs: 4606 cells/uL (ref 1500–7800)
Neutrophils Relative %: 58.3 %
Platelets: 305 10*3/uL (ref 140–400)
RBC: 4.16 10*6/uL (ref 3.80–5.10)
RDW: 12.5 % (ref 11.0–15.0)
Total Lymphocyte: 28.9 %
WBC: 7.9 10*3/uL (ref 3.8–10.8)

## 2023-01-15 LAB — LIPID PANEL
Cholesterol: 224 mg/dL — ABNORMAL HIGH (ref <200)
HDL: 70 mg/dL (ref >=50)
LDL Cholesterol (Calc): 135 mg/dL (calc) — ABNORMAL HIGH
Non-HDL Cholesterol (Calc): 154 mg/dL (calc) — ABNORMAL HIGH (ref <130)
Total CHOL/HDL Ratio: 3.2 (calc) (ref <5.0)
Triglycerides: 86 mg/dL (ref <150)

## 2023-01-15 LAB — COMPLETE METABOLIC PANEL WITH GFR
AG Ratio: 2.1 (calc) (ref 1.0–2.5)
ALT: 12 U/L (ref 6–29)
AST: 17 U/L (ref 10–35)
Albumin: 4.5 g/dL (ref 3.6–5.1)
Alkaline phosphatase (APISO): 50 U/L (ref 37–153)
BUN: 15 mg/dL (ref 7–25)
CO2: 24 mmol/L (ref 20–32)
Calcium: 9.2 mg/dL (ref 8.6–10.4)
Chloride: 100 mmol/L (ref 98–110)
Creat: 0.65 mg/dL (ref 0.60–1.00)
Globulin: 2.1 g/dL (calc) (ref 1.9–3.7)
Glucose, Bld: 88 mg/dL (ref 65–99)
Potassium: 4.8 mmol/L (ref 3.5–5.3)
Sodium: 135 mmol/L (ref 135–146)
Total Bilirubin: 0.3 mg/dL (ref 0.2–1.2)
Total Protein: 6.6 g/dL (ref 6.1–8.1)
eGFR: 94 mL/min/{1.73_m2} (ref >=60)

## 2023-01-15 LAB — TSH: TSH: 2.53 mIU/L (ref 0.40–4.50)

## 2023-04-29 ENCOUNTER — Ambulatory Visit (INDEPENDENT_AMBULATORY_CARE_PROVIDER_SITE_OTHER): Payer: Medicare Other

## 2023-04-29 VITALS — Ht 66.0 in | Wt 134.0 lb

## 2023-04-29 DIAGNOSIS — Z0000 Encounter for general adult medical examination without abnormal findings: Secondary | ICD-10-CM | POA: Diagnosis not present

## 2023-04-29 NOTE — Progress Notes (Signed)
Subjective:   Shannon Shelton is a 72 y.o. female who presents for an Initial Medicare Annual Wellness Visit.  Visit Complete: Virtual  I connected with  Shannon Shelton on 04/29/23 by a audio enabled telemedicine application and verified that I am speaking with the correct person using two identifiers.  Patient Location: Home  Provider Location: Home Office  I discussed the limitations of evaluation and management by telemedicine. The patient expressed understanding and agreed to proceed.  Patient Medicare AWV questionnaire was completed by the patient on 04/28/23; I have confirmed that all information answered by patient is correct and no changes since this date.  Vital Signs: Unable to obtain new vitals due to this being a telehealth visit.  Review of Systems     Cardiac Risk Factors include: advanced age (>32men, >32 women)     Objective:    Today's Vitals   04/29/23 1422  Weight: 134 lb (60.8 kg)  Height: 5\' 6"  (1.676 m)   Body mass index is 21.63 kg/m.     04/29/2023    3:50 PM 03/24/2022   11:30 AM 03/10/2022    9:49 AM  Advanced Directives  Does Patient Have a Medical Advance Directive? No Yes No  Type of Advance Directive  Living will   Would patient like information on creating a medical advance directive? Yes (MAU/Ambulatory/Procedural Areas - Information given)      Current Medications (verified) Outpatient Encounter Medications as of 04/29/2023  Medication Sig   estradiol (ESTRACE) 0.1 MG/GM vaginal cream Place 1 Applicatorful vaginally at bedtime. Every other day   levothyroxine (SYNTHROID, LEVOTHROID) 112 MCG tablet Take 1 tablet (112 mcg total) by mouth daily.   Multiple Vitamins-Minerals (PRESERVISION AREDS 2 PO) Take 1 tablet by mouth in the morning and at bedtime.   [DISCONTINUED] citalopram (CELEXA) 10 MG tablet Take 10 mg by mouth daily.   No facility-administered encounter medications on file as of 04/29/2023.    Allergies  (verified) Sulfamethoxazole-trimethoprim   History: Past Medical History:  Diagnosis Date   Hyperlipidemia    Rheumatic fever    Thyroid disease    hyperlipidemia   Past Surgical History:  Procedure Laterality Date   PROLAPSED UTERINE FIBROID LIGATION     Family History  Problem Relation Age of Onset   Heart disease Father    COPD Father        He was a smoker    Cancer Maternal Grandmother        Colon Cancer   Cancer Maternal Grandfather    Cancer Cousin        Brain Tumor    Social History   Socioeconomic History   Marital status: Married    Spouse name: Shannon Shelton   Number of children: 2   Years of education: 14   Highest education level: Not on file  Occupational History   Occupation: Retired  Tobacco Use   Smoking status: Never   Smokeless tobacco: Never  Substance and Sexual Activity   Alcohol use: Yes    Comment: occasionally   Drug use: No   Sexual activity: Yes  Other Topics Concern   Not on file  Social History Narrative   Marital Status: Married Radiation protection practitioner)    Children:  Sons (2)    Pets: Dogs (2)    Living Situation: Lives with  husband   Occupation:  Retired (Real estate)    Education: Engineer, maintenance (IT)    Tobacco Use/Exposure:  None    Alcohol Use:  Occasional  Drug Use:  None   Diet:  Regular   Exercise:  Walking   Hobbies: Yard Work/Photography            Social Determinants of Health   Financial Resource Strain: Low Risk  (04/28/2023)   Overall Financial Resource Strain (CARDIA)    Difficulty of Paying Living Expenses: Not hard at all  Food Insecurity: No Food Insecurity (04/28/2023)   Hunger Vital Sign    Worried About Running Out of Food in the Last Year: Never true    Ran Out of Food in the Last Year: Never true  Transportation Needs: No Transportation Needs (04/28/2023)   PRAPARE - Administrator, Civil Service (Medical): No    Lack of Transportation (Non-Medical): No  Physical Activity: Insufficiently Active (04/28/2023)    Exercise Vital Sign    Days of Exercise per Week: 4 days    Minutes of Exercise per Session: 30 min  Stress: Stress Concern Present (04/28/2023)   [name] of Occupational Health - Occupational Stress Questionnaire    Feeling of Stress : Rather much  Social Connections: Moderately Isolated (04/28/2023)   Social Connection and Isolation Panel [NHANES]    Frequency of Communication with Friends and Family: More than three times a week    Frequency of Social Gatherings with Friends and Family: Once a week    Attends Religious Services: 1 to 4 times per year    Active Member of [employer] or Organizations: No    Attends Banker Meetings: Never    Marital Status: Widowed    Tobacco Counseling Counseling given: Not Answered   Clinical Intake:  Pre-visit preparation completed: Yes  Pain : No/denies pain     Diabetes: No  How often do you need to have someone help you when you read instructions, pamphlets, or other written materials from your doctor or pharmacy?: 1 - Never  Interpreter Needed?: No  Information entered by :: Shannon Fantasia LPN   Activities of Daily Living    04/28/2023    6:11 PM  In your present state of health, do you have any difficulty performing the following activities:  Hearing? 0  Vision? 0  Difficulty concentrating or making decisions? 0  Walking or climbing stairs? 0  Dressing or bathing? 0  Doing errands, shopping? 0  Preparing Food and eating ? N  Using the Toilet? N  In the past six months, have you accidently leaked urine? N  Do you have problems with loss of bowel control? N  Managing your Medications? N  Managing your Finances? N  Housekeeping or managing your Housekeeping? N    Patient Care Team: Donita Brooks, MD as PCP - General (Family Medicine) [facility] Little Ishikawa, MD as Consulting Physician (Cardiology)  Indicate any recent Medical Services you may have received from other  than [facility] providers in the past year (date may be approximate).     Assessment:   This is a routine wellness examination for [place].  Hearing/Vision screen Hearing Screening - Comments:: Denies hearing difficulties   Vision Screening - Comments:: Wears rx glasses - up to date with routine eye exams with [facility]    Dietary issues and exercise activities discussed:     Goals Addressed             This Visit's Progress    Remain active and independent        Depression Screen    04/29/2023  3:47 PM 01/14/2023    2:45 PM  PHQ 2/9 Scores  PHQ - 2 Score 0 0    Fall Risk    04/28/2023    6:11 PM 01/14/2023    2:45 PM  Fall Risk   Falls in the past year? 0 0  Number falls in past yr: 0 0  Injury with Fall? 0 0  Risk for fall due to : No Fall Risks No Fall Risks  Follow up Falls prevention discussed;Education provided;Falls evaluation completed Falls prevention discussed    MEDICARE RISK AT HOME:   TIMED UP AND GO:  Was the test performed? No    Cognitive Function:        04/29/2023    3:50 PM  6CIT Screen  What Year? 0 points  What month? 0 points  What time? 0 points  Count back from 20 0 points  Months in reverse 0 points  Repeat phrase 0 points  Total Score 0 points    Immunizations Immunization History  Administered Date(s) Administered   Hepatitis A, Adult 04/02/1998   Hepatitis A, Ped/Adol-2 Dose 04/02/1998   Influenza Split 07/25/2007   Influenza, High Dose Seasonal PF 07/10/2019   Influenza,inj,Quad PF,6+ Mos 06/23/2013, 07/03/2015, 06/30/2016, 07/12/2017, 07/12/2018   Influenza,inj,quad, With Preservative 07/23/2014   Influenza-Unspecified 06/23/2013   PPD Test 04/02/1998, 04/02/1998   Pneumococcal Conjugate-13 06/30/2016   Pneumococcal Polysaccharide-23 07/12/2017, 07/12/2017   Td 04/02/1998, 04/29/2008   Td (Adult),5 Lf Tetanus Toxid, Preservative Free 04/29/2008   Tdap 04/02/1998, 12/23/2010   Zoster  Recombinant(Shingrix) 04/04/2019, 06/13/2019   Zoster, Live 04/28/2012    TDAP status: Due, Education has been provided regarding the importance of this vaccine. Advised may receive this vaccine at local pharmacy or Health Dept. Aware to provide a copy of the vaccination record if obtained from local pharmacy or Health Dept. Verbalized acceptance and understanding.  Flu Vaccine status: Due, Education has been provided regarding the importance of this vaccine. Advised may receive this vaccine at local pharmacy or Health Dept. Aware to provide a copy of the vaccination record if obtained from local pharmacy or Health Dept. Verbalized acceptance and understanding.  Pneumococcal vaccine status: Up to date  Covid-19 vaccine status: Information provided on how to obtain vaccines.   Qualifies for Shingles Vaccine? Yes   Zostavax completed No   Shingrix Completed?: Yes  Screening Tests Health Maintenance  Topic Date Due   DTaP/Tdap/Td (6 - Td or Tdap) 12/22/2020   COVID-19 Vaccine (1 - 2023-24 season) Never done   INFLUENZA VACCINE  04/22/2023   Hepatitis C Screening  01/14/2024 (Originally 06/09/1969)   MAMMOGRAM  03/05/2024   Medicare Annual Wellness (AWV)  04/28/2024   Colonoscopy  04/02/2030   Pneumonia Vaccine 17+ Years old  Completed   DEXA SCAN  Completed   Zoster Vaccines- Shingrix  Completed   HPV VACCINES  Aged Out    Health Maintenance  Health Maintenance Due  Topic Date Due   DTaP/Tdap/Td (6 - Td or Tdap) 12/22/2020   COVID-19 Vaccine (1 - 2023-24 season) Never done   INFLUENZA VACCINE  04/22/2023    Colorectal cancer screening: Type of screening: Colonoscopy. Completed 04/02/20. Repeat every 5 years  Mammogram status: Ordered today. Pt provided with contact info and advised to call to schedule appt.   Bone Density status: Completed 01/29/21. Results reflect: Bone density results: OSTEOPENIA. Repeat every 2 years.  Lung Cancer Screening: (Low Dose CT Chest recommended  if Age 78-80 years, 20 pack-year currently smoking OR  have quit w/in 15years.) does not qualify.   Lung Cancer Screening Referral: n/a  Additional Screening:  Hepatitis C Screening: does qualify;  Vision Screening: Recommended annual ophthalmology exams for early detection of glaucoma and other disorders of the eye. Is the patient up to date with their annual eye exam?  Yes  Who is the provider or what is the name of the office in which the patient attends annual eye exams? [facility]  If pt is not established with a provider, would they like to be referred to a provider to establish care? No .   Dental Screening: Recommended annual dental exams for proper oral hygiene  Community Resource Referral / Chronic Care Management: CRR required this visit?  No   CCM required this visit?  No     Plan:     I have personally reviewed and noted the following in the patient's chart:   Medical and social history Use of alcohol, tobacco or illicit drugs  Current medications and supplements including opioid prescriptions. Patient is not currently taking opioid prescriptions. Functional ability and status Nutritional status Physical activity Advanced directives List of other physicians Hospitalizations, surgeries, and ER visits in previous 12 months Vitals Screenings to include cognitive, depression, and falls Referrals and appointments  In addition, I have reviewed and discussed with patient certain preventive protocols, quality metrics, and best practice recommendations. A written personalized care plan for preventive services as well as general preventive health recommendations were provided to patient.     Shannon Shelton [place], [place]   10/29/4130   After Visit Summary: (MyChart) Due to this being a telephonic visit, the after visit summary with patients personalized plan was offered to patient via MyChart   Nurse Notes: No concerns at this time

## 2023-04-29 NOTE — Patient Instructions (Signed)
Ms. Shannon Shelton , Thank you for taking time to come for your Medicare Wellness Visit. I appreciate your ongoing commitment to your health goals. Please review the following plan we discussed and let me know if I can assist you in the future.   Referrals/Orders/Follow-Ups/Clinician Recommendations: Aim for 30 minutes of exercise or brisk walking, 6-8 glasses of water, and 5 servings of fruits and vegetables each day.  This is a list of the screening recommended for you and due dates:  Health Maintenance  Topic Date Due   DTaP/Tdap/Td vaccine (6 - Td or Tdap) 12/22/2020   COVID-19 Vaccine (1 - 2023-24 season) Never done   Flu Shot  04/22/2023   Hepatitis C Screening  01/14/2024*   Mammogram  03/05/2024   Medicare Annual Wellness Visit  04/28/2024   Colon Cancer Screening  04/02/2030   Pneumonia Vaccine  Completed   DEXA scan (bone density measurement)  Completed   Zoster (Shingles) Vaccine  Completed   HPV Vaccine  Aged Out  *Topic was postponed. The date shown is not the original due date.    Advanced directives: (ACP Link)Information on Advanced Care Planning can be found at [facility] Advance Health Care Directives Advance Health Care Directives ([email or web address]   Next Medicare Annual Wellness Visit scheduled for next year: Yes  Preventive Care 65 Years and Older, Female Preventive care refers to lifestyle choices and visits with your health care provider that can promote health and wellness. What does preventive care include? A yearly physical exam. This is also called an annual well check. Dental exams once or twice a year. Routine eye exams. Ask your health care provider how often you should have your eyes checked. Personal lifestyle choices, including: Daily care of your teeth and gums. Regular physical activity. Eating a healthy diet. Avoiding tobacco and drug use. Limiting alcohol use. Practicing safe sex. Taking low-dose aspirin every day. Taking vitamin  and mineral supplements as recommended by your health care provider. What happens during an annual well check? The services and screenings done by your health care provider during your annual well check will depend on your age, overall health, lifestyle risk factors, and family history of disease. Counseling  Your health care provider may ask you questions about your: Alcohol use. Tobacco use. Drug use. Emotional well-being. Home and relationship well-being. Sexual activity. Eating habits. History of falls. Memory and ability to understand (cognition). Work and work Astronomer. Reproductive health. Screening  You may have the following tests or measurements: Height, weight, and BMI. Blood pressure. Lipid and cholesterol levels. These may be checked every 5 years, or more frequently if you are over 46 years old. Skin check. Lung cancer screening. You may have this screening every year starting at age 18 if you have a 30-pack-year history of smoking and currently smoke or have quit within the past 15 years. Fecal occult blood test (FOBT) of the stool. You may have this test every year starting at age 32. Flexible sigmoidoscopy or colonoscopy. You may have a sigmoidoscopy every 5 years or a colonoscopy every 10 years starting at age 60. Hepatitis C blood test. Hepatitis B blood test. Sexually transmitted disease (STD) testing. Diabetes screening. This is done by checking your blood sugar (glucose) after you have not eaten for a while (fasting). You may have this done every 1-3 years. Bone density scan. This is done to screen for osteoporosis. You may have this done starting at age 15. Mammogram. This may be done every 1-2  years. Talk to your health care provider about how often you should have regular mammograms. Talk with your health care provider about your test results, treatment options, and if necessary, the need for more tests. Vaccines  Your health care provider may recommend  certain vaccines, such as: Influenza vaccine. This is recommended every year. Tetanus, diphtheria, and acellular pertussis (Tdap, Td) vaccine. You may need a Td booster every 10 years. Zoster vaccine. You may need this after age 68. Pneumococcal 13-valent conjugate (PCV13) vaccine. One dose is recommended after age 56. Pneumococcal polysaccharide (PPSV23) vaccine. One dose is recommended after age 27. Talk to your health care provider about which screenings and vaccines you need and how often you need them. This information is not intended to replace advice given to you by your health care provider. Make sure you discuss any questions you have with your health care provider. Document Released: 10/04/2015 Document Revised: 05/27/2016 Document Reviewed: 07/09/2015 Elsevier Interactive Patient Education  2017 [employer].  Fall Prevention in the Home Falls can cause injuries. They can happen to people of all ages. There are many things you can do to make your home safe and to help prevent falls. What can I do on the outside of my home? Regularly fix the edges of walkways and driveways and fix any cracks. Remove anything that might make you trip as you walk through a door, such as a raised step or threshold. Trim any bushes or trees on the path to your home. Use bright outdoor lighting. Clear any walking paths of anything that might make someone trip, such as rocks or tools. Regularly check to see if handrails are loose or broken. Make sure that both sides of any steps have handrails. Any raised decks and porches should have guardrails on the edges. Have any leaves, snow, or ice cleared regularly. Use sand or salt on walking paths during winter. Clean up any spills in your garage right away. This includes oil or grease spills. What can I do in the bathroom? Use night lights. Install grab bars by the toilet and in the tub and shower. Do not use towel bars as grab bars. Use non-skid mats or  decals in the tub or shower. If you need to sit down in the shower, use a plastic, non-slip stool. Keep the floor dry. Clean up any water that spills on the floor as soon as it happens. Remove soap buildup in the tub or shower regularly. Attach bath mats securely with double-sided non-slip rug tape. Do not have throw rugs and other things on the floor that can make you trip. What can I do in the bedroom? Use night lights. Make sure that you have a light by your bed that is easy to reach. Do not use any sheets or blankets that are too big for your bed. They should not hang down onto the floor. Have a firm chair that has side arms. You can use this for support while you get dressed. Do not have throw rugs and other things on the floor that can make you trip. What can I do in the kitchen? Clean up any spills right away. Avoid walking on wet floors. Keep items that you use a lot in easy-to-reach places. If you need to reach something above you, use a strong step stool that has a grab bar. Keep electrical cords out of the way. Do not use floor polish or wax that makes floors slippery. If you must use wax, use non-skid floor  wax. Do not have throw rugs and other things on the floor that can make you trip. What can I do with my stairs? Do not leave any items on the stairs. Make sure that there are handrails on both sides of the stairs and use them. Fix handrails that are broken or loose. Make sure that handrails are as long as the stairways. Check any carpeting to make sure that it is firmly attached to the stairs. Fix any carpet that is loose or worn. Avoid having throw rugs at the top or bottom of the stairs. If you do have throw rugs, attach them to the floor with carpet tape. Make sure that you have a light switch at the top of the stairs and the bottom of the stairs. If you do not have them, ask someone to add them for you. What else can I do to help prevent falls? Wear shoes that: Do not  have high heels. Have rubber bottoms. Are comfortable and fit you well. Are closed at the toe. Do not wear sandals. If you use a stepladder: Make sure that it is fully opened. Do not climb a closed stepladder. Make sure that both sides of the stepladder are locked into place. Ask someone to hold it for you, if possible. Clearly mark and make sure that you can see: Any grab bars or handrails. First and last steps. Where the edge of each step is. Use tools that help you move around (mobility aids) if they are needed. These include: Canes. Walkers. Scooters. Crutches. Turn on the lights when you go into a dark area. Replace any light bulbs as soon as they burn out. Set up your furniture so you have a clear path. Avoid moving your furniture around. If any of your floors are uneven, fix them. If there are any pets around you, be aware of where they are. Review your medicines with your doctor. Some medicines can make you feel dizzy. This can increase your chance of falling. Ask your doctor what other things that you can do to help prevent falls. This information is not intended to replace advice given to you by your health care provider. Make sure you discuss any questions you have with your health care provider. Document Released: 07/04/2009 Document Revised: 02/13/2016 Document Reviewed: 10/12/2014 Elsevier Interactive Patient Education  2017 [employer].

## 2023-07-09 ENCOUNTER — Other Ambulatory Visit (HOSPITAL_COMMUNITY): Payer: Self-pay | Admitting: Family Medicine

## 2023-07-09 DIAGNOSIS — Z1231 Encounter for screening mammogram for malignant neoplasm of breast: Secondary | ICD-10-CM

## 2023-07-14 ENCOUNTER — Ambulatory Visit (HOSPITAL_COMMUNITY)
Admission: RE | Admit: 2023-07-14 | Discharge: 2023-07-14 | Disposition: A | Discharge status: Home / Self Care | Payer: Medicare Other | Source: Ambulatory Visit | Attending: Family Medicine | Admitting: Family Medicine

## 2023-07-14 ENCOUNTER — Encounter (HOSPITAL_COMMUNITY): Payer: Self-pay

## 2023-07-14 DIAGNOSIS — Z1231 Encounter for screening mammogram for malignant neoplasm of breast: Secondary | ICD-10-CM | POA: Diagnosis present

## 2023-08-30 ENCOUNTER — Telehealth: Payer: Self-pay | Admitting: Family Medicine

## 2023-08-30 NOTE — Telephone Encounter (Signed)
Copied from CRM [id number]. Topic: Appointments - Scheduling Inquiry for Clinic >> Aug 30, 2023  2:43 PM Danika B wrote: Reason for CRM: Patient inquired about appt for Urgent care follow up, had bad fall last week, staples placed and removed, having headaches, may need brain imaging per urgent care. No appts available until next week. Patient would like to be advised on next steps. Callback [phone]

## 2023-08-31 ENCOUNTER — Encounter (HOSPITAL_COMMUNITY): Payer: Self-pay

## 2023-08-31 ENCOUNTER — Emergency Department (HOSPITAL_COMMUNITY)
Admission: EM | Admit: 2023-08-31 | Discharge: 2023-08-31 | Discharge status: Against Medical Advice | Payer: Medicare Other | Attending: Emergency Medicine | Admitting: Emergency Medicine

## 2023-08-31 DIAGNOSIS — Z5321 Procedure and treatment not carried out due to patient leaving prior to being seen by health care provider: Secondary | ICD-10-CM | POA: Insufficient documentation

## 2023-08-31 DIAGNOSIS — S0181XA Laceration without foreign body of other part of head, initial encounter: Secondary | ICD-10-CM | POA: Insufficient documentation

## 2023-08-31 DIAGNOSIS — W1830XA Fall on same level, unspecified, initial encounter: Secondary | ICD-10-CM | POA: Insufficient documentation

## 2023-08-31 DIAGNOSIS — R519 Headache, unspecified: Secondary | ICD-10-CM | POA: Diagnosis present

## 2023-08-31 NOTE — ED Triage Notes (Signed)
Pt is coming in for continued head pain that she has experienced for 10 days now, this head pain is originating from a fall that occurred 10 days ago in which she hit her head, this caused a laceration on the back of her head that she needed staples for ( 5 in total). Pt has no other complaints other than the continued head pain. She was advised to come in that day but waited until to come in. She has also been self medicating with tylenol at home

## 2023-08-31 NOTE — ED Notes (Signed)
Pt left AMA saying she cannot wait any longer

## 2023-09-01 ENCOUNTER — Other Ambulatory Visit (HOSPITAL_COMMUNITY): Payer: Self-pay | Admitting: Internal Medicine

## 2023-09-01 ENCOUNTER — Ambulatory Visit (HOSPITAL_COMMUNITY)
Admission: RE | Admit: 2023-09-01 | Discharge: 2023-09-01 | Disposition: A | Discharge status: Home / Self Care | Payer: Medicare Other | Source: Ambulatory Visit | Attending: Internal Medicine | Admitting: Internal Medicine

## 2023-09-01 ENCOUNTER — Telehealth: Payer: Self-pay

## 2023-09-01 DIAGNOSIS — S0291XA Unspecified fracture of skull, initial encounter for closed fracture: Secondary | ICD-10-CM

## 2023-09-01 NOTE — Transitions of Care (Post Inpatient/ED Visit) (Signed)
   09/01/2023  Name: Shannon Shelton MRN: [id number] DOB: [birth year]  Today's TOC FU Call Status: Today's TOC FU Call Status:: Successful TOC FU Call Completed TOC FU Call Complete Date: 09/01/23 Patient's Name and Date of Birth confirmed.  Transition Care Management Follow-up Telephone Call Date of Discharge: 08/31/23 Discharge Facility: [facility]) Type of Discharge: Emergency Department Reason for ED Visit: Other: (headache) How have you been since you were released from the hospital?: Same Any questions or concerns?: No  Items Reviewed: Did you receive and understand the discharge instructions provided?: Yes Medications obtained,verified, and reconciled?: No Medications Not Reviewed Reasons:: Other: (elopement) Any new allergies since your discharge?: No Dietary orders reviewed?: NA Do you have support at home?: Yes People in Home: spouse  Medications Reviewed Today: Medications Reviewed Today     Reviewed by Karena Addison, LPN (Licensed Practical Nurse) on 09/01/23 at 1225  Med List Status: <None>   Medication Order Taking? Sig Documenting Provider Last Dose Status Informant  estradiol (ESTRACE) 0.1 MG/GM vaginal cream [id number] No Place 1 Applicatorful vaginally at bedtime. Every other day [provider] Taking Active Self  levothyroxine (SYNTHROID, LEVOTHROID) 112 MCG tablet [id number] No Take 1 tablet (112 mcg total) by mouth daily. Gillian Scarce, MD Taking Expired 04/29/23 2359 Self  Multiple Vitamins-Minerals (PRESERVISION AREDS 2 PO) [id number] No Take 1 tablet by mouth in the morning and at bedtime. [provider] Taking Active Self            Home Care and Equipment/Supplies: Were Home Health Services Ordered?: NA Any new equipment or medical supplies ordered?: NA  Functional Questionnaire: Do you need assistance with bathing/showering or dressing?: No Do you need assistance with meal preparation?: No Do you need assistance with  eating?: No Do you have difficulty maintaining continence: No Do you need assistance with getting out of bed/getting out of a chair/moving?: No Do you have difficulty managing or taking your medications?: No  Follow up appointments reviewed: PCP Follow-up appointment confirmed?: No (declined appt, seeing another PCP) MD Provider Line Number:[phone] Given: No Specialist Hospital Follow-up appointment confirmed?: NA Do you need transportation to your follow-up appointment?: No Do you understand care options if your condition(s) worsen?: Yes-patient verbalized understanding    SIGNATURE Karena Addison, LPN [facility] Nurse Health Advisor Direct Dial [phone]

## 2023-09-03 ENCOUNTER — Ambulatory Visit: Payer: Medicare Other | Admitting: Family Medicine

## 2024-02-09 ENCOUNTER — Other Ambulatory Visit (HOSPITAL_COMMUNITY): Payer: Self-pay | Admitting: Internal Medicine

## 2024-02-09 DIAGNOSIS — R059 Cough, unspecified: Secondary | ICD-10-CM

## 2024-05-04 ENCOUNTER — Ambulatory Visit: Payer: Medicare Other

## 2024-10-03 ENCOUNTER — Other Ambulatory Visit (HOSPITAL_COMMUNITY): Payer: Self-pay | Admitting: Internal Medicine

## 2024-10-03 DIAGNOSIS — Z1231 Encounter for screening mammogram for malignant neoplasm of breast: Secondary | ICD-10-CM

## 2024-10-04 ENCOUNTER — Encounter (HOSPITAL_COMMUNITY): Payer: Self-pay

## 2024-10-04 ENCOUNTER — Ambulatory Visit (HOSPITAL_COMMUNITY)
Admission: RE | Admit: 2024-10-04 | Discharge: 2024-10-04 | Disposition: A | Discharge status: Home / Self Care | Source: Ambulatory Visit | Attending: Internal Medicine | Admitting: Internal Medicine

## 2024-10-04 DIAGNOSIS — Z1231 Encounter for screening mammogram for malignant neoplasm of breast: Secondary | ICD-10-CM | POA: Diagnosis present

## 2024-10-05 ENCOUNTER — Other Ambulatory Visit (HOSPITAL_COMMUNITY): Payer: Self-pay | Admitting: Internal Medicine

## 2024-10-05 DIAGNOSIS — R109 Unspecified abdominal pain: Secondary | ICD-10-CM

## 2024-10-13 ENCOUNTER — Ambulatory Visit (HOSPITAL_COMMUNITY)
Admission: RE | Admit: 2024-10-13 | Discharge: 2024-10-13 | Disposition: A | Source: Ambulatory Visit | Attending: Internal Medicine | Admitting: Internal Medicine

## 2024-10-13 DIAGNOSIS — R109 Unspecified abdominal pain: Secondary | ICD-10-CM | POA: Diagnosis present
